# Patient Record
Sex: Female | Born: 1940 | Hispanic: No | Smoking: Never smoker
Health system: Southern US, Community
[De-identification: ages and names within clinical notes are randomized; demographics above are authoritative.]

## PROBLEM LIST (undated history)

## (undated) DIAGNOSIS — G43909 Migraine, unspecified, not intractable, without status migrainosus: Secondary | ICD-10-CM

## (undated) DIAGNOSIS — K219 Gastro-esophageal reflux disease without esophagitis: Secondary | ICD-10-CM

## (undated) DIAGNOSIS — M1990 Unspecified osteoarthritis, unspecified site: Secondary | ICD-10-CM

## (undated) DIAGNOSIS — N8110 Cystocele, unspecified: Secondary | ICD-10-CM

## (undated) DIAGNOSIS — I10 Essential (primary) hypertension: Secondary | ICD-10-CM

## (undated) DIAGNOSIS — E785 Hyperlipidemia, unspecified: Secondary | ICD-10-CM

## (undated) DIAGNOSIS — J4489 Other specified chronic obstructive pulmonary disease: Secondary | ICD-10-CM

## (undated) DIAGNOSIS — J449 Chronic obstructive pulmonary disease, unspecified: Secondary | ICD-10-CM

## (undated) DIAGNOSIS — E119 Type 2 diabetes mellitus without complications: Secondary | ICD-10-CM

## (undated) HISTORY — DX: Gastro-esophageal reflux disease without esophagitis: K21.9

## (undated) HISTORY — DX: Migraine, unspecified, not intractable, without status migrainosus: G43.909

## (undated) HISTORY — DX: Essential (primary) hypertension: I10

## (undated) HISTORY — DX: Type 2 diabetes mellitus without complications: E11.9

## (undated) HISTORY — DX: Other specified chronic obstructive pulmonary disease: J44.89

## (undated) HISTORY — DX: Hyperlipidemia, unspecified: E78.5

## (undated) HISTORY — DX: Chronic obstructive pulmonary disease, unspecified: J44.9

## (undated) HISTORY — DX: Unspecified osteoarthritis, unspecified site: M19.90

## (undated) HISTORY — PX: NO PAST SURGERIES: SHX2092

## (undated) HISTORY — DX: Cystocele, unspecified: N81.10

---

## 2011-01-10 ENCOUNTER — Ambulatory Visit (INDEPENDENT_AMBULATORY_CARE_PROVIDER_SITE_OTHER): Payer: PRIVATE HEALTH INSURANCE | Admitting: Internal Medicine

## 2011-01-10 ENCOUNTER — Encounter: Payer: Self-pay | Admitting: Internal Medicine

## 2011-01-10 DIAGNOSIS — K5900 Constipation, unspecified: Secondary | ICD-10-CM | POA: Insufficient documentation

## 2011-01-10 DIAGNOSIS — M7989 Other specified soft tissue disorders: Secondary | ICD-10-CM

## 2011-01-10 DIAGNOSIS — E785 Hyperlipidemia, unspecified: Secondary | ICD-10-CM | POA: Insufficient documentation

## 2011-01-10 DIAGNOSIS — G43909 Migraine, unspecified, not intractable, without status migrainosus: Secondary | ICD-10-CM | POA: Insufficient documentation

## 2011-01-10 DIAGNOSIS — I10 Essential (primary) hypertension: Secondary | ICD-10-CM

## 2011-01-10 NOTE — Assessment & Plan Note (Signed)
On statin therapy for treatment of same since 2010 Daughter will bring copy of last laboratory values done (In [place] 12/2010>WNL per pt report) The current medical regimen is effective;  continue present plan and medications.

## 2011-01-10 NOTE — Progress Notes (Signed)
Subjective:    Patient ID: Christina Thornton, female    DOB: 1940/05/05, 71 y.o.   MRN: [id number]  HPI  New patient to me and our practice, here today to establish care  Reviewed chronic medical issues today:  Hypertension. On ARB -the patient reports compliance with medication(s) as prescribed. Denies adverse side effects.   Dyslipidemia. On atorvastatin for years. the patient reports compliance with medication(s) as prescribed. Denies adverse side effects.  Migraine history - MRI in 2010 done (in [place]) for same showing chronic microvascular disease but no aneurysm or mass. Takes calcium channel blocker for prophylaxis (flunarizine) - to complete this medication at the end of January 2013.  Concerned about swelling of left calf Reports onset of symptoms over 2 years ago Prior evaluation in the 12 heard the swelling was related to "cyst" in her knee (?bakers) Initially associated with pain in knee but now no pain in knee or leg No change in swelling Shortness of breath or chest pain No prior history of clots  Past Medical History  Diagnosis Date  . Arthritis   . Glaucoma   . Hypertension   . Osteoporosis   . Hx of migraines   . Dyslipidemia    Family History  Problem Relation Age of Onset  . Cancer Brother     Kidney cancer   History  Substance Use Topics  . Smoking status: Never Smoker   . Smokeless tobacco: Not on file  . Alcohol Use: No   Review of Systems Constitutional: Negative for fever or weight change.  Respiratory: Negative for cough and shortness of breath.   Cardiovascular: Negative for chest pain or palpitations.  Gastrointestinal: Negative for abdominal pain, no bowel changes.  chronic constipation Musculoskeletal: Negative for gait problem or joint swelling.  Skin: Negative for rash.  Neurological: Negative for dizziness or headache.  No other specific complaints in a complete review of systems (except as listed in HPI above).       Objective:   Physical Exam BP 100/60  Pulse 81  Temp(Src) 97.9 F (36.6 C) (Oral)  Ht 5' (1.524 m)  Wt 171 lb 12.8 oz (77.928 kg)  BMI 33.55 kg/m2  SpO2 97% Wt Readings from Last 3 Encounters:  01/10/11 171 lb 12.8 oz (77.928 kg)   Constitutional: She is overweight but appears well-developed and well-nourished. No distress.  daughter at side serves as Nurse, learning disability (patient speaks no [place]) HENT: Head: Normocephalic and atraumatic. Ears: R TM ok, L TM with chronic rupture - no erythema or effusion; Nose: Nose normal. Mouth/Throat: Oropharynx is clear and moist. No oropharyngeal exudate.  Eyes: Conjunctivae and EOM are normal. Pupils are equal, round, and reactive to light. No scleral icterus.  Neck: Normal range of motion. Neck supple. No JVD present. No thyromegaly present.  Cardiovascular: Normal rate, regular rhythm and normal heart sounds.  No murmur heard. No BLE edema. Pulmonary/Chest: Effort normal and breath sounds normal. No respiratory distress. She has no wheezes.  Abdominal: Soft. Bowel sounds are normal. She exhibits no distension. There is no tenderness. no masses Musculoskeletal: Chronic swelling left calf compared to right  - bilateral knees with osteoarthritic changes, minimal effusion on left . Normal range of motion, no abnormal warmth. No gross deformities. Left greater than right foot bunions  GU: Defer to gynecology  Neurological: She is alert and oriented to person, place, and time. No cranial nerve deficit. Coordination normal.  Skin: Skin is warm and dry. No rash noted. No erythema.  Psychiatric: She has  a normal mood and affect. Her behavior is normal. Judgment and thought content normal.   No results found for this basename: WBC, HGB, HCT, PLT, GLUCOSE, CHOL, TRIG, HDL, LDLDIRECT, LDLCALC, ALT, AST, NA, K, CL, CREATININE, BUN, CO2, TSH, PSA, INR, GLUF, HGBA1C, MICROALBUR       Assessment & Plan:  See problem list. Medications and labs reviewed today. Time  spent with pt/family today 45 minutes, greater than 50% time spent counseling patient on  migraines, lipids, hypertension and medication review.   Chronic left lower extremity swelling, reports relation to "cyst behind knee" - at risk for venous thromboembolic disease with overseas airline travel. Check venous Doppler now - if persisting Baker's cyst, referred to orthopedics for evaluation as needed

## 2011-01-10 NOTE — Assessment & Plan Note (Signed)
BP Readings from Last 3 Encounters:  01/10/11 100/60   The current medical regimen is effective;  continue present plan and medications.

## 2011-01-10 NOTE — Patient Instructions (Signed)
It was good to see you today. we'll make referral for venous ultrasound of left leg to evaluate swelling. Our office will contact you regarding appointment(s) once made. Medications reviewed, no changes made today Recommend trying over-the-counter Senokot for bowels in addition to ongoing prune juice. Can also try MiraLAX powder as needed for constipation Please schedule followup in 6 months for lab recheck of cholesterol, kidneys and liver; call sooner if problems.

## 2011-01-10 NOTE — Assessment & Plan Note (Signed)
On CCB prophylaxis until end 01/2011 (unavailable in [place]> flunarizine) Reports symptoms have improved on treatment Continue same as per [place] providers and consider alternate prophylactic medication if resumed or recurrent headache symptoms

## 2011-01-10 NOTE — Assessment & Plan Note (Signed)
Functional, chronic Recommended addition of Senokot to daily prune juice and MiraLAX as needed

## 2011-01-11 ENCOUNTER — Other Ambulatory Visit: Payer: Self-pay | Admitting: Cardiology

## 2011-01-11 ENCOUNTER — Ambulatory Visit (INDEPENDENT_AMBULATORY_CARE_PROVIDER_SITE_OTHER): Payer: PRIVATE HEALTH INSURANCE | Admitting: Cardiology

## 2011-01-11 DIAGNOSIS — R229 Localized swelling, mass and lump, unspecified: Secondary | ICD-10-CM

## 2011-01-11 DIAGNOSIS — M7989 Other specified soft tissue disorders: Secondary | ICD-10-CM

## 2011-01-11 DIAGNOSIS — R609 Edema, unspecified: Secondary | ICD-10-CM

## 2011-02-27 ENCOUNTER — Ambulatory Visit: Payer: Self-pay | Admitting: Internal Medicine

## 2011-03-29 ENCOUNTER — Encounter: Payer: Self-pay | Admitting: Internal Medicine

## 2011-03-29 ENCOUNTER — Telehealth: Payer: Self-pay | Admitting: *Deleted

## 2011-03-29 ENCOUNTER — Ambulatory Visit (INDEPENDENT_AMBULATORY_CARE_PROVIDER_SITE_OTHER): Payer: PRIVATE HEALTH INSURANCE | Admitting: Internal Medicine

## 2011-03-29 VITALS — BP 128/80 | HR 68 | Temp 97.7°F | Ht 62.0 in | Wt 175.0 lb

## 2011-03-29 DIAGNOSIS — K219 Gastro-esophageal reflux disease without esophagitis: Secondary | ICD-10-CM | POA: Insufficient documentation

## 2011-03-29 DIAGNOSIS — M7120 Synovial cyst of popliteal space [Baker], unspecified knee: Secondary | ICD-10-CM | POA: Insufficient documentation

## 2011-03-29 DIAGNOSIS — M7541 Impingement syndrome of right shoulder: Secondary | ICD-10-CM | POA: Insufficient documentation

## 2011-03-29 MED ORDER — PANTOPRAZOLE SODIUM 40 MG PO TBEC: 40.0000 mg | DELAYED_RELEASE_TABLET | Freq: Every day | ORAL | Status: DC

## 2011-03-29 MED ORDER — RABEPRAZOLE SODIUM 20 MG PO TBEC: 20.0000 mg | DELAYED_RELEASE_TABLET | Freq: Every day | ORAL | Status: DC

## 2011-03-29 MED ORDER — NAPROXEN SODIUM 220 MG PO TABS: 220.0000 mg | ORAL_TABLET | Freq: Two times a day (BID) | ORAL | Status: AC

## 2011-03-29 NOTE — Assessment & Plan Note (Signed)
On rabeprazole with droperidol oral combo from [place] - explained same med combo not available here We'll resume PPI for increase in reflux symptoms since discontinuing medication. If unable to afford AcipHex, will change to generic PPI

## 2011-03-29 NOTE — Patient Instructions (Signed)
It was good to see you today. Your shoulder pain is called "impingement syndrome" - continue aleve 2x/day for 1 month and will refer to physical therapy  Continue your acid mediction - Your prescription(s) have been submitted to your pharmacy. Please take as directed and contact our office if you believe you are having problem(s) with the medication(s).  Impingement Syndrome, Rotator Cuff, Bursitis with Rehab Impingement syndrome is a condition that involves inflammation of the tendons of the rotator cuff and the subacromial bursa, that causes pain in the shoulder. The rotator cuff consists of four tendons and muscles that control much of the shoulder and upper arm function. The subacromial bursa is a fluid filled sac that helps reduce friction between the rotator cuff and one of the bones of the shoulder (acromion). Impingement syndrome is usually an overuse injury that causes swelling of the bursa (bursitis), swelling of the tendon (tendonitis), and/or a tear of the tendon (strain). Strains are classified into three categories. Grade 1 strains cause pain, but the tendon is not lengthened. Grade 2 strains include a lengthened ligament, due to the ligament being stretched or partially ruptured. With grade 2 strains there is still function, although the function may be decreased. Grade 3 strains include a complete tear of the tendon or muscle, and function is usually impaired. SYMPTOMS    Pain around the shoulder, often at the outer portion of the upper arm.   Pain that gets worse with shoulder function, especially when reaching overhead or lifting.   Sometimes, aching when not using the arm.   Pain that wakes you up at night.   Sometimes, tenderness, swelling, warmth, or redness over the affected area.   Loss of strength.   Limited motion of the shoulder, especially reaching behind the back (to the back pocket or to unhook bra) or across your body.   Crackling sound (crepitation) when moving  the arm.   Biceps tendon pain and inflammation (in the front of the shoulder). Worse when bending the elbow or lifting.  CAUSES   Impingement syndrome is often an overuse injury, in which chronic (repetitive) motions cause the tendons or bursa to become inflamed. A strain occurs when a force is paced on the tendon or muscle that is greater than it can withstand. Common mechanisms of injury include: Stress from sudden increase in duration, frequency, or intensity of training.  Direct hit (trauma) to the shoulder.   Aging, erosion of the tendon with normal use.   Bony bump on shoulder (acromial spur).  RISK INCREASES WITH:  Contact sports (football, wrestling, boxing).   Throwing sports (baseball, tennis, volleyball).   Weightlifting and bodybuilding.   Heavy labor.   Previous injury to the rotator cuff, including impingement.   Poor shoulder strength and flexibility.   Failure to warm up properly before activity.   Inadequate protective equipment.   Old age.   Bony bump on shoulder (acromial spur).  PREVENTION    Warm up and stretch properly before activity.   Allow for adequate recovery between workouts.   Maintain physical fitness:   Strength, flexibility, and endurance.   Cardiovascular fitness.   Learn and use proper exercise technique.  PROGNOSIS   If treated properly, impingement syndrome usually goes away within 6 weeks. Sometimes surgery is required.   RELATED COMPLICATIONS    Longer healing time if not properly treated, or if not given enough time to heal.   Recurring symptoms, that result in a chronic condition.   Shoulder stiffness, frozen  shoulder, or loss of motion.   Rotator cuff tendon tear.   Recurring symptoms, especially if activity is resumed too soon, with overuse, with a direct blow, or when using poor technique.  TREATMENT   Treatment first involves the use of ice and medicine, to reduce pain and inflammation. The use of strengthening  and stretching exercises may help reduce pain with activity. These exercises may be performed at home or with a therapist. If non-surgical treatment is unsuccessful after more than 6 months, surgery may be advised. After surgery and rehabilitation, activity is usually possible in 3 months.   MEDICATION  If pain medicine is needed, nonsteroidal anti-inflammatory medicines (aspirin and ibuprofen), or other minor pain relievers (acetaminophen), are often advised.   Do not take pain medicine for 7 days before surgery.   Prescription pain relievers may be given, if your caregiver thinks they are needed. Use only as directed and only as much as you need.   Corticosteroid injections may be given by your caregiver. These injections should be reserved for the most serious cases, because they may only be given a certain number of times.  HEAT AND COLD  Cold treatment (icing) should be applied for 10 to 15 minutes every 2 to 3 hours for inflammation and pain, and immediately after activity that aggravates your symptoms. Use ice packs or an ice massage.   Heat treatment may be used before performing stretching and strengthening activities prescribed by your caregiver, physical therapist, or athletic trainer. Use a heat pack or a warm water soak.  SEEK MEDICAL CARE IF:    Symptoms get worse or do not improve in 4 to 6 weeks, despite treatment.   New, unexplained symptoms develop. (Drugs used in treatment may produce side effects.)  EXERCISES   RANGE OF MOTION (ROM) AND STRETCHING EXERCISES - Impingement Syndrome (Rotator Cuff  Tendinitis, Bursitis) These exercises may help you when beginning to rehabilitate your injury. Your symptoms may go away with or without further involvement from your physician, physical therapist or athletic trainer. While completing these exercises, remember:    Restoring tissue flexibility helps normal motion to return to the joints. This allows healthier, less painful movement  and activity.   An effective stretch should be held for at least 30 seconds.   A stretch should never be painful. You should only feel a gentle lengthening or release in the stretched tissue.  STRETCH - Flexion, Standing  Stand with good posture. With an underhand grip on your right / left hand, and an overhand grip on the opposite hand, grasp a broomstick or cane so that your hands are a little more than shoulder width apart.   Keeping your right / left elbow straight and shoulder muscles relaxed, push the stick with your opposite hand, to raise your right / left arm in front of your body and then overhead. Raise your arm until you feel a stretch in your right / left shoulder, but before you have increased shoulder pain.   Try to avoid shrugging your right / left shoulder as your arm rises, by keeping your shoulder blade tucked down and toward your mid-back spine. Hold for __________ seconds.   Slowly return to the starting position.  Repeat __________ times. Complete this exercise __________ times per day. STRETCH - Abduction, Supine  Lie on your back. With an underhand grip on your right / left hand and an overhand grip on the opposite hand, grasp a broomstick or cane so that your hands are a  little more than shoulder width apart.   Keeping your right / left elbow straight and your shoulder muscles relaxed, push the stick with your opposite hand, to raise your right / left arm out to the side of your body and then overhead. Raise your arm until you feel a stretch in your right / left shoulder, but before you have increased shoulder pain.   Try to avoid shrugging your right / left shoulder as your arm rises, by keeping your shoulder blade tucked down and toward your mid-back spine. Hold for __________ seconds.   Slowly return to the starting position.  Repeat __________ times. Complete this exercise __________ times per day. ROM - Flexion, Active-Assisted  Lie on your back. You may bend  your knees for comfort.   Grasp a broomstick or cane so your hands are about shoulder width apart. Your right / left hand should grip the end of the stick, so that your hand is positioned "thumbs-up," as if you were about to shake hands.   Using your healthy arm to lead, raise your right / left arm overhead, until you feel a gentle stretch in your shoulder. Hold for __________ seconds.   Use the stick to assist in returning your right / left arm to its starting position.  Repeat __________ times. Complete this exercise __________ times per day.   ROM - Internal Rotation, Supine   Lie on your back on a firm surface. Place your right / left elbow about 60 degrees away from your side. Elevate your elbow with a folded towel, so that the elbow and shoulder are the same height.   Using a broomstick or cane and your strong arm, pull your right / left hand toward your body until you feel a gentle stretch, but no increase in your shoulder pain. Keep your shoulder and elbow in place throughout the exercise.   Hold for __________ seconds. Slowly return to the starting position.  Repeat __________ times. Complete this exercise __________ times per day. STRETCH - Internal Rotation  Place your right / left hand behind your back, palm up.   Throw a towel or belt over your opposite shoulder. Grasp the towel with your right / left hand.   While keeping an upright posture, gently pull up on the towel, until you feel a stretch in the front of your right / left shoulder.   Avoid shrugging your right / left shoulder as your arm rises, by keeping your shoulder blade tucked down and toward your mid-back spine.   Hold for __________ seconds. Release the stretch, by lowering your healthy hand.  Repeat __________ times. Complete this exercise __________ times per day. ROM - Internal Rotation   Using an underhand grip, grasp a stick behind your back with both hands.   While standing upright with good posture,  slide the stick up your back until you feel a mild stretch in the front of your shoulder.   Hold for __________ seconds. Slowly return to your starting position.  Repeat __________ times. Complete this exercise __________ times per day.   STRETCH - Posterior Shoulder Capsule   Stand or sit with good posture. Grasp your right / left elbow and draw it across your chest, keeping it at the same height as your shoulder.   Pull your elbow, so your upper arm comes in closer to your chest. Pull until you feel a gentle stretch in the back of your shoulder.   Hold for __________ seconds.  Repeat __________ times.  Complete this exercise __________ times per day. STRENGTHENING EXERCISES - Impingement Syndrome (Rotator Cuff Tendinitis, Bursitis) These exercises may help you when beginning to rehabilitate your injury. They may resolve your symptoms with or without further involvement from your physician, physical therapist or athletic trainer. While completing these exercises, remember:  Muscles can gain both the endurance and the strength needed for everyday activities through controlled exercises.   Complete these exercises as instructed by your physician, physical therapist or athletic trainer. Increase the resistance and repetitions only as guided.   You may experience muscle soreness or fatigue, but the pain or discomfort you are trying to eliminate should never worsen during these exercises. If this pain does get worse, stop and make sure you are following the directions exactly. If the pain is still present after adjustments, discontinue the exercise until you can discuss the trouble with your clinician.   During your recovery, avoid activity or exercises which involve actions that place your injured hand or elbow above your head or behind your back or head. These positions stress the tissues which you are trying to heal.  STRENGTH - Scapular Depression and Adduction   With good posture, sit on a  firm chair. Support your arms in front of you, with pillows, arm rests, or on a table top. Have your elbows in line with the sides of your body.   Gently draw your shoulder blades down and toward your mid-back spine. Gradually increase the tension, without tensing the muscles along the top of your shoulders and the back of your neck.   Hold for __________ seconds. Slowly release the tension and relax your muscles completely before starting the next repetition.   After you have practiced this exercise, remove the arm support and complete the exercise in standing as well as sitting position.  Repeat __________ times. Complete this exercise __________ times per day.   STRENGTH - Shoulder Abductors, Isometric  With good posture, stand or sit about 4-6 inches from a wall, with your right / left side facing the wall.   Bend your right / left elbow. Gently press your right / left elbow into the wall. Increase the pressure gradually, until you are pressing as hard as you can, without shrugging your shoulder or increasing any shoulder discomfort.   Hold for __________ seconds.   Release the tension slowly. Relax your shoulder muscles completely before you begin the next repetition.  Repeat __________ times. Complete this exercise __________ times per day.   STRENGTH - External Rotators, Isometric  Keep your right / left elbow at your side and bend it 90 degrees.   Step into a door frame so that the outside of your right / left wrist can press against the door frame without your upper arm leaving your side.   Gently press your right / left wrist into the door frame, as if you were trying to swing the back of your hand away from your stomach. Gradually increase the tension, until you are pressing as hard as you can, without shrugging your shoulder or increasing any shoulder discomfort.   Hold for __________ seconds.   Release the tension slowly. Relax your shoulder muscles completely before you  begin the next repetition.  Repeat __________ times. Complete this exercise __________ times per day.   STRENGTH - Supraspinatus   Stand or sit with good posture. Grasp a __________ weight, or an exercise band or tubing, so that your hand is "thumbs-up," like you are shaking hands.   Slowly  lift your right / left arm in a "V" away from your thigh, diagonally into the space between your side and straight ahead. Lift your hand to shoulder height or as far as you can, without increasing any shoulder pain. At first, many people do not lift their hands above shoulder height.   Avoid shrugging your right / left shoulder as your arm rises, by keeping your shoulder blade tucked down and toward your mid-back spine.   Hold for __________ seconds. Control the descent of your hand, as you slowly return to your starting position.  Repeat __________ times. Complete this exercise __________ times per day.   STRENGTH - External Rotators  Secure a rubber exercise band or tubing to a fixed object (table, pole) so that it is at the same height as your right / left elbow when you are standing or sitting on a firm surface.   Stand or sit so that the secured exercise band is at your uninjured side.   Bend your right / left elbow 90 degrees. Place a folded towel or small pillow under your right / left arm, so that your elbow is a few inches away from your side.   Keeping the tension on the exercise band, pull it away from your body, as if pivoting on your elbow. Be sure to keep your body steady, so that the movement is coming only from your rotating shoulder.   Hold for __________ seconds. Release the tension in a controlled manner, as you return to the starting position.  Repeat __________ times. Complete this exercise __________ times per day.   STRENGTH - Internal Rotators   Secure a rubber exercise band or tubing to a fixed object (table, pole) so that it is at the same height as your right / left elbow when  you are standing or sitting on a firm surface.   Stand or sit so that the secured exercise band is at your right / left side.   Bend your elbow 90 degrees. Place a folded towel or small pillow under your right / left arm so that your elbow is a few inches away from your side.   Keeping the tension on the exercise band, pull it across your body, toward your stomach. Be sure to keep your body steady, so that the movement is coming only from your rotating shoulder.   Hold for __________ seconds. Release the tension in a controlled manner, as you return to the starting position.  Repeat __________ times. Complete this exercise __________ times per day.   STRENGTH - Scapular Protractors, Standing   Stand arms length away from a wall. Place your hands on the wall, keeping your elbows straight.   Begin by dropping your shoulder blades down and toward your mid-back spine.   To strengthen your protractors, keep your shoulder blades down, but slide them forward on your rib cage. It will feel as if you are lifting the back of your rib cage away from the wall. This is a subtle motion and can be challenging to complete. Ask your caregiver for further instruction, if you are not sure you are doing the exercise correctly.   Hold for __________ seconds. Slowly return to the starting position, resting the muscles completely before starting the next repetition.  Repeat __________ times. Complete this exercise __________ times per day. STRENGTH - Scapular Protractors, Supine  Lie on your back on a firm surface. Extend your right / left arm straight into the air while holding a __________  weight in your hand.   Keeping your head and back in place, lift your shoulder off the floor.   Hold for __________ seconds. Slowly return to the starting position, and allow your muscles to relax completely before starting the next repetition.  Repeat __________ times. Complete this exercise __________ times per  day. STRENGTH - Scapular Protractors, Quadruped  Get onto your hands and knees, with your shoulders directly over your hands (or as close as you can be, comfortably).   Keeping your elbows locked, lift the back of your rib cage up into your shoulder blades, so your mid-back rounds out. Keep your neck muscles relaxed.   Hold this position for __________ seconds. Slowly return to the starting position and allow your muscles to relax completely before starting the next repetition.  Repeat __________ times. Complete this exercise __________ times per day.   STRENGTH - Scapular Retractors  Secure a rubber exercise band or tubing to a fixed object (table, pole), so that it is at the height of your shoulders when you are either standing, or sitting on a firm armless chair.   With a palm down grip, grasp an end of the band in each hand. Straighten your elbows and lift your hands straight in front of you, at shoulder height. Step back, away from the secured end of the band, until it becomes tense.   Squeezing your shoulder blades together, draw your elbows back toward your sides, as you bend them. Keep your upper arms lifted away from your body throughout the exercise.   Hold for __________ seconds. Slowly ease the tension on the band, as you reverse the directions and return to the starting position.  Repeat __________ times. Complete this exercise __________ times per day. STRENGTH - Shoulder Extensors   Secure a rubber exercise band or tubing to a fixed object (table, pole) so that it is at the height of your shoulders when you are either standing, or sitting on a firm armless chair.   With a thumbs-up grip, grasp an end of the band in each hand. Straighten your elbows and lift your hands straight in front of you, at shoulder height. Step back, away from the secured end of the band, until it becomes tense.   Squeezing your shoulder blades together, pull your hands down to the sides of your thighs.  Do not allow your hands to go behind you.   Hold for __________ seconds. Slowly ease the tension on the band, as you reverse the directions and return to the starting position.  Repeat __________ times. Complete this exercise __________ times per day.   STRENGTH - Scapular Retractors and External Rotators   Secure a rubber exercise band or tubing to a fixed object (table, pole) so that it is at the height as your shoulders, when you are either standing, or sitting on a firm armless chair.   With a palm down grip, grasp an end of the band in each hand. Bend your elbows 90 degrees and lift your elbows to shoulder height, at your sides. Step back, away from the secured end of the band, until it becomes tense.   Squeezing your shoulder blades together, rotate your shoulders so that your upper arms and elbows remain stationary, but your fists travel upward to head height.   Hold for __________ seconds. Slowly ease the tension on the band, as you reverse the directions and return to the starting position.  Repeat __________ times. Complete this exercise __________ times per day.  STRENGTH - Scapular Retractors and External Rotators, Rowing   Secure a rubber exercise band or tubing to a fixed object (table, pole) so that it is at the height of your shoulders, when you are either standing, or sitting on a firm armless chair.   With a palm down grip, grasp an end of the band in each hand. Straighten your elbows and lift your hands straight in front of you, at shoulder height. Step back, away from the secured end of the band, until it becomes tense.   Step 1: Squeeze your shoulder blades together. Bending your elbows, draw your hands to your chest, as if you are rowing a boat. At the end of this motion, your hands and elbow should be at shoulder height and your elbows should be out to your sides.   Step 2: Rotate your shoulders, to raise your hands above your head. Your forearms should be vertical and  your upper arms should be horizontal.   Hold for __________ seconds. Slowly ease the tension on the band, as you reverse the directions and return to the starting position.  Repeat __________ times. Complete this exercise __________ times per day.   STRENGTH - Scapular Depressors  Find a sturdy chair without wheels, such as a dining room chair.   Keeping your feet on the floor, and your hands on the chair arms, lift your bottom up from the seat, and lock your elbows.   Keeping your elbows straight, allow gravity to pull your body weight down. Your shoulders will rise toward your ears.   Raise your body against gravity by drawing your shoulder blades down your back, shortening the distance between your shoulders and ears. Although your feet should always maintain contact with the floor, your feet should progressively support less body weight, as you get stronger.   Hold for __________ seconds. In a controlled and slow manner, lower your body weight to begin the next repetition.  Repeat __________ times. Complete this exercise __________ times per day.   Document Released: 12/18/2004 Document Revised: 12/07/2010 Document Reviewed: 04/01/2008 [facility] Patient Information 2012 [place], [place].

## 2011-03-29 NOTE — Progress Notes (Signed)
Subjective:    Patient ID: Christina Thornton, female    DOB: 05/27/1940, 71 y.o.   MRN: [id number]  Arm Pain  The incident occurred more than 1 week ago. The incident occurred at home. There was no injury mechanism. The pain is present in the right shoulder. The quality of the pain is described as aching. Radiates to: Distal deltoid. The pain is at a severity of 3/10. The pain is mild. The pain has been improving since the incident. Pertinent negatives include no chest pain, muscle weakness or numbness. The symptoms are aggravated by lifting (Overhead activity). She has tried NSAIDs for the symptoms. The treatment provided moderate relief.   Also wants to review venous Doppler regarding Left leg and knee swelling, ?cyst mgmt options. chronic swelling of left calf without change. Reports onset of symptoms in 2009. Initially associated with pain in knee but now no pain in knee or leg  ?resume reflux med from [place]?  Also reviewed chronic medical issues today:  Hypertension. On ARB -the patient reports compliance with medication(s) as prescribed. Denies adverse side effects.   Dyslipidemia. On atorvastatin for years. the patient reports compliance with medication(s) as prescribed. Denies adverse side effects.  Migraine history - MRI in 2010 done (in [place]) for same showing chronic microvascular disease but no aneurysm or mass. Takes calcium channel blocker for prophylaxis (flunarizine) - to complete this medication at the end of January 2013.   Past Medical History  Diagnosis Date  . Osteoarthritis   . Glaucoma   . Hypertension   . Osteoporosis   . Migraine headache   . Dyslipidemia   . Female bladder prolapse      Review of Systems  Cardiovascular: Negative for chest pain.  Neurological: Negative for numbness.  Constitutional: Negative for fever or weight change.  Respiratory: Negative for cough and shortness of breath.   Gastrointestinal: Negative for abdominal pain.   chronic constipation      Objective:   Physical Exam  BP 128/80  Pulse 68  Temp(Src) 97.7 F (36.5 C) (Oral)  Ht 5\' 2"  (1.575 m)  Wt 175 lb (79.379 kg)  BMI 32.01 kg/m2  SpO2 99% Wt Readings from Last 3 Encounters:  03/29/11 175 lb (79.379 kg)  01/10/11 171 lb 12.8 oz (77.928 kg)   Constitutional: She is overweight but appears well-developed and well-nourished. No distress.  daughter at side serves as Nurse, learning disability (patient speaks no English) Neck: Normal range of motion. Neck supple. No JVD present. No thyromegaly present.  Cardiovascular: Normal rate, regular rhythm and normal heart sounds.  No murmur heard. No BLE edema. Pulmonary/Chest: Effort normal and breath sounds normal. No respiratory distress. She has no wheezes.  Musculoskeletal: Chronic swelling left calf compared to right  - bilateral knees with osteoarthritic changes, minimal effusion on left . Normal range of motion, no abnormal warmth. No gross deformities. Left greater than right foot bunions - R Shoulder: Full range of motion. Neurovascularly intact distally. Good strength with stress of rotator cuff but causes pain. Positive impingement signs. Neurological: She is alert and oriented to person, place, and time. No cranial nerve deficit. Coordination normal.  Skin: Dry skin, winter dermatitis B LE/feet. Skin is warm and dry. No rash noted. No erythema.    No results found for this basename: WBC,  HGB,  HCT,  PLT,  GLUCOSE,  CHOL,  TRIG,  HDL,  LDLDIRECT,  LDLCALC,  ALT,  AST,  NA,  K,  CL,  CREATININE,  BUN,  CO2,  TSH,  PSA,  INR,  GLUF,  HGBA1C,  MICROALBUR   LLE venous doppler 01/2011: popliteal cyst 2.0x3.0 cm - prev 4.5x2.9 cm 06/2007 doppler    Assessment & Plan:  See problem list. Medications and labs reviewed today.  Impingement syndrome, right shoulder - reviewed diagnosis with patient and family today. Offered steroid injection but as patient has improved with twice a day Aleve, will first maximize oral  anti-inflammatory therapy. Also refer to physical therapy and provided exercises to help shoulder strengthening. Will reconsider need for steroid injection if symptoms worse or unimproved with this care  Chronic left lower extremity swelling, due to popliteal cyst behind knee Reviewed smaller size in 2013 compared to 2009, also decrease in knee pain since 2009 If recurring knee pain, increased swelling or other problems, will refer to orthopedics for evaluation as needed

## 2011-03-29 NOTE — Telephone Encounter (Signed)
Daughter called insurance will not cover the Rabeprazole. Requesting md to rx something else generic... 03/29/11@2 :20pm/LMB

## 2011-03-29 NOTE — Telephone Encounter (Signed)
Daughter was advise med will be sent to pharmacy... 03/29/11@2 :24pm/LMB

## 2011-03-29 NOTE — Telephone Encounter (Signed)
Change to pantoprotazole (chosen over omeprazole b/c pt also on plavix) - ex done

## 2011-07-12 ENCOUNTER — Ambulatory Visit (INDEPENDENT_AMBULATORY_CARE_PROVIDER_SITE_OTHER): Payer: PRIVATE HEALTH INSURANCE | Admitting: Internal Medicine

## 2011-07-12 ENCOUNTER — Encounter: Payer: Self-pay | Admitting: Internal Medicine

## 2011-07-12 ENCOUNTER — Other Ambulatory Visit (INDEPENDENT_AMBULATORY_CARE_PROVIDER_SITE_OTHER): Payer: PRIVATE HEALTH INSURANCE

## 2011-07-12 VITALS — BP 112/78 | HR 72 | Temp 98.2°F | Ht 62.0 in | Wt 187.0 lb

## 2011-07-12 DIAGNOSIS — E785 Hyperlipidemia, unspecified: Secondary | ICD-10-CM

## 2011-07-12 DIAGNOSIS — Z1239 Encounter for other screening for malignant neoplasm of breast: Secondary | ICD-10-CM

## 2011-07-12 DIAGNOSIS — R5381 Other malaise: Secondary | ICD-10-CM

## 2011-07-12 DIAGNOSIS — K219 Gastro-esophageal reflux disease without esophagitis: Secondary | ICD-10-CM

## 2011-07-12 DIAGNOSIS — R5383 Other fatigue: Secondary | ICD-10-CM

## 2011-07-12 DIAGNOSIS — I10 Essential (primary) hypertension: Secondary | ICD-10-CM

## 2011-07-12 DIAGNOSIS — Z124 Encounter for screening for malignant neoplasm of cervix: Secondary | ICD-10-CM

## 2011-07-12 LAB — CBC WITH DIFFERENTIAL/PLATELET
Basophils Absolute: 0.1 10*3/uL (ref 0.0–0.1)
Basophils Relative: 0.7 % (ref 0.0–3.0)
Eosinophils Absolute: 0.1 10*3/uL (ref 0.0–0.7)
HCT: 39 % (ref 36.0–46.0)
Hemoglobin: 12.9 g/dL (ref 12.0–15.0)
Lymphocytes Relative: 25.2 % (ref 12.0–46.0)
Lymphs Abs: 1.8 10*3/uL (ref 0.7–4.0)
MCHC: 33 g/dL (ref 30.0–36.0)
MCV: 88.5 fl (ref 78.0–100.0)
Monocytes Absolute: 0.5 10*3/uL (ref 0.1–1.0)
Neutro Abs: 4.7 10*3/uL (ref 1.4–7.7)
RBC: 4.41 Mil/uL (ref 3.87–5.11)
RDW: 14.3 % (ref 11.5–14.6)

## 2011-07-12 LAB — HEPATIC FUNCTION PANEL
ALT: 12 U/L (ref 0–35)
AST: 20 U/L (ref 0–37)
Albumin: 3.7 g/dL (ref 3.5–5.2)
Total Protein: 7.4 g/dL (ref 6.0–8.3)

## 2011-07-12 LAB — LIPID PANEL
Cholesterol: 136 mg/dL (ref 0–200)
HDL: 73.3 mg/dL (ref >39.00)
Triglycerides: 58 mg/dL (ref 0.0–149.0)

## 2011-07-12 LAB — BASIC METABOLIC PANEL
Calcium: 9.1 mg/dL (ref 8.4–10.5)
Creatinine, Ser: 0.8 mg/dL (ref 0.4–1.2)
GFR: 77.49 mL/min (ref >60.00)
Sodium: 139 mEq/L (ref 135–145)

## 2011-07-12 LAB — TSH: TSH: 1.56 u[IU]/mL (ref 0.35–5.50)

## 2011-07-12 MED ORDER — PANTOPRAZOLE SODIUM 40 MG PO TBEC: 40.0000 mg | DELAYED_RELEASE_TABLET | Freq: Every day | ORAL | Status: DC

## 2011-07-12 NOTE — Progress Notes (Signed)
Subjective:    Patient ID: Christina Thornton, female    DOB: 03-17-1940, 71 y.o.   MRN: [id number]  HPI  here for follow up - reviewed chronic medical issues today:  Hypertension. On ARB -the patient reports compliance with medication(s) as prescribed. Denies adverse side effects.   Dyslipidemia. On atorvastatin for years. the patient reports compliance with medication(s) as prescribed. Denies adverse side effects.  Migraine history - MRI in 2010 done (in [place]) for same showing chronic microvascular disease but no aneurysm or mass. Takes calcium channel blocker for prophylaxis (flunarizine) - to complete this medication at the end of January 2013.  GERD - on PPI with good symptoms control - the patient reports compliance with medication(s) as prescribed. Denies adverse side effects.   Also requests gyn refer for hx D&C 2011 (in [place]) - no bleeding but ?follow up   Past Medical History  Diagnosis Date  . Osteoarthritis   . Glaucoma   . Hypertension   . Osteoporosis   . Migraine headache   . Dyslipidemia   . Female bladder prolapse   . GERD (gastroesophageal reflux disease)      Review of Systems Constitutional: Negative for fever or unexpected weight change. +fatigue, tired Respiratory: Negative for cough and shortness of breath.   Gastrointestinal: Negative for abdominal pain.  chronic constipation      Objective:   Physical Exam  BP 112/78  Pulse 72  Temp 98.2 F (36.8 C) (Oral)  Ht 5\' 2"  (1.575 m)  Wt 187 lb (84.823 kg)  BMI 34.20 kg/m2  SpO2 97% Wt Readings from Last 3 Encounters:  07/12/11 187 lb (84.823 kg)  03/29/11 175 lb (79.379 kg)  01/10/11 171 lb 12.8 oz (77.928 kg)   Constitutional: She is overweight but appears well-developed and well-nourished. No distress.  daughter at side serves as Nurse, learning disability (patient speaks no [place]) Cardiovascular: Normal rate, regular rhythm and normal heart sounds.  No murmur heard. No BLE edema. Pulmonary/Chest:  Effort normal and breath sounds normal. No respiratory distress. She has no wheezes.  Musculoskeletal: Chronic mild swelling left calf compared to right  - bilateral knees with osteoarthritic changes, minimal effusion on left . Normal range of motion, no abnormal warmth. No gross deformities. Left greater than right foot bunions - R Shoulder: Full range of motion. Neurovascularly intact distally. Good strength with stress of rotator cuff but causes pain. Positive impingement signs. Skin: Dry mild dermatitis B LE/feet. Skin is warm and dry. No rash noted. No erythema.   No results found for this basename: WBC, HGB, HCT, PLT, GLUCOSE, CHOL, TRIG, HDL, LDLDIRECT, LDLCALC, ALT, AST, NA, K, CL, CREATININE, BUN, CO2, TSH, PSA, INR, GLUF, HGBA1C, MICROALBUR    LLE venous doppler 01/2011: popliteal cyst 2.0x3.0 cm - prev 4.5x2.9 cm 06/2007 doppler    Assessment & Plan:  See problem list. Medications and labs reviewed today.  Fatigue - nonspecific hx and exam - will check screening labs -  Impingement syndrome, right shoulder - symptoms ongoing but much improved with oral anti-inflammatory therapy and physical therapy 04/2011. Also uses [place] muscle relaxer (thiocolchicoside 4mg  prn) - will call if [employer] med needed in future. Will reconsider need for steroid injection or ortho eval if symptoms worse or unimproved with this care  Chronic left lower extremity swelling, due to popliteal cyst behind knee - reviewed symptoms again today. Reviewed smaller size in 01/2011 compared to 2009, also decrease in knee pain since 2009 If recurring knee pain, increased swelling or other problems, will  refer to orthopedics for evaluation as needed

## 2011-07-12 NOTE — Assessment & Plan Note (Signed)
BP Readings from Last 3 Encounters:  07/12/11 112/78  03/29/11 128/80  01/10/11 100/60   The current medical regimen is effective;  continue present plan and medications.

## 2011-07-12 NOTE — Patient Instructions (Signed)
It was good to see you today. Test(s) ordered today. Your results will be called to you after review (48-72hours after test completion). If any changes need to be made, you will be notified at that time. Medications reviewed, no changes at this time. Let us know if you need any muscle relaxer prescription in the future Refill on medication(s) as discussed today. we'll make referral to gynecology and for mammogram screening. Our office will contact you regarding appointment(s) once made. Consider colonoscopy screening and call if you would like referral for this test Please schedule followup in 6 months, call sooner if problems.

## 2011-07-12 NOTE — Assessment & Plan Note (Signed)
On statin therapy for treatment of same since 2010 Check lipids annually (reviewed laboratory values 12/2010 done in [place]) The current medical regimen is effective;  continue present plan and medications.

## 2011-07-12 NOTE — Assessment & Plan Note (Signed)
On rabeprazole with droperidol oral combo from [place] - explained same med combo not available here Started protoprazole 03/2011 due to increase in reflux symptoms since discontinuing medication and unable to afford Aciphex here. The current medical regimen is effective;  continue present plan and medications.

## 2011-07-25 ENCOUNTER — Telehealth: Payer: Self-pay

## 2011-07-25 MED ORDER — PANTOPRAZOLE SODIUM 40 MG PO TBEC: 40.0000 mg | DELAYED_RELEASE_TABLET | Freq: Every day | ORAL | Status: DC

## 2011-07-25 NOTE — Telephone Encounter (Signed)
Pt's daughter called requesting Rx for Protonix be sent to [facility] for a 90 day supply per [employer].

## 2011-08-08 ENCOUNTER — Encounter: Payer: Self-pay | Admitting: Internal Medicine

## 2011-10-12 ENCOUNTER — Telehealth: Payer: Self-pay | Admitting: Internal Medicine

## 2011-10-12 NOTE — Telephone Encounter (Signed)
Left message on machine for pt's daughter to return my call  

## 2011-10-12 NOTE — Telephone Encounter (Signed)
Please call dtr to see what she has in mind - thanks

## 2011-10-12 NOTE — Telephone Encounter (Signed)
Patients daughter is calling because she says the Protonix is not helping her mother and she would like to speak with someone to discuss other medication options

## 2011-10-15 NOTE — Telephone Encounter (Signed)
Left message on machine for pt's daughter to return my call  

## 2011-10-17 NOTE — Telephone Encounter (Signed)
Left message on machine for pt's dauhgter to return my call. Closing phone note until further contact from pt/daughter

## 2011-10-18 ENCOUNTER — Telehealth: Payer: Self-pay

## 2011-10-18 MED ORDER — RABEPRAZOLE SODIUM 20 MG PO TBEC: 20.0000 mg | DELAYED_RELEASE_TABLET | Freq: Every day | ORAL | Status: AC

## 2011-10-18 NOTE — Telephone Encounter (Signed)
Ok for change  Please let family know, besides aciphex which is likely expensive (and the protonix apparently not working as well), there is generic prevacid which might be OK for her and be less expensive, or even nexium might be less expensive than aciphex

## 2011-10-18 NOTE — Telephone Encounter (Signed)
Pt's daughter called stating Protonix is not helping with pt's GERD sxs. Pt is requesting to be changed back to Aciphex, please advise in Dr Diamantina Monks absence, thanks!

## 2011-10-18 NOTE — Telephone Encounter (Signed)
Patients daughter informed

## 2011-11-01 ENCOUNTER — Ambulatory Visit (INDEPENDENT_AMBULATORY_CARE_PROVIDER_SITE_OTHER): Payer: PRIVATE HEALTH INSURANCE | Admitting: General Practice

## 2011-11-01 DIAGNOSIS — Z23 Encounter for immunization: Secondary | ICD-10-CM

## 2012-01-17 ENCOUNTER — Ambulatory Visit (INDEPENDENT_AMBULATORY_CARE_PROVIDER_SITE_OTHER): Payer: PRIVATE HEALTH INSURANCE | Admitting: Internal Medicine

## 2012-01-17 ENCOUNTER — Encounter: Payer: Self-pay | Admitting: Internal Medicine

## 2012-01-17 VITALS — BP 112/78 | HR 76 | Temp 98.0°F | Ht 60.0 in | Wt 183.0 lb

## 2012-01-17 DIAGNOSIS — I10 Essential (primary) hypertension: Secondary | ICD-10-CM

## 2012-01-17 DIAGNOSIS — E119 Type 2 diabetes mellitus without complications: Secondary | ICD-10-CM | POA: Insufficient documentation

## 2012-01-17 DIAGNOSIS — R739 Hyperglycemia, unspecified: Secondary | ICD-10-CM

## 2012-01-17 DIAGNOSIS — G43909 Migraine, unspecified, not intractable, without status migrainosus: Secondary | ICD-10-CM

## 2012-01-17 DIAGNOSIS — E785 Hyperlipidemia, unspecified: Secondary | ICD-10-CM

## 2012-01-17 DIAGNOSIS — R7309 Other abnormal glucose: Secondary | ICD-10-CM

## 2012-01-17 MED ORDER — BENZONATATE 100 MG PO CAPS: 100.0000 mg | ORAL_CAPSULE | Freq: Two times a day (BID) | ORAL | Status: DC | PRN

## 2012-01-17 MED ORDER — DICLOFENAC SODIUM 75 MG PO TBEC: 75.0000 mg | DELAYED_RELEASE_TABLET | Freq: Two times a day (BID) | ORAL | Status: DC | PRN

## 2012-01-17 NOTE — Assessment & Plan Note (Signed)
On statin therapy for treatment of same since 2010 Check lipids  The current medical regimen is effective;  continue present plan and medications.

## 2012-01-17 NOTE — Assessment & Plan Note (Signed)
On CCB prophylaxis until end 01/2011 (unavailable in [place]> flunarizine) Reports occular symptoms have recurred off treatment, but does not wish to resume same MRI brain [place] 2010 as above  - consider alternate prophylactic medication if persisting headache symptoms

## 2012-01-17 NOTE — Progress Notes (Signed)
Subjective:    Patient ID: Christina Thornton, female    DOB: [birth year], 72 y.o.   MRN: [id number]  HPI here for follow up - reviewed chronic medical issues today:  Hypertension. On ARB -the patient reports compliance with medication(s) as prescribed. Denies adverse side effects.   Dyslipidemia. On atorvastatin for years. the patient reports compliance with medication(s) as prescribed. Denies adverse side effects.  Migraine history - MRI in 2010 done (in [place]) for same showing chronic microvascular disease but no aneurysm or mass. Previously on calcium channel blocker for prophylaxis (flunarizine) thru January 2013, does not wish to resume same but increasing occular symptoms off medication (flash followed by headache every 3-4 weeks).  GERD - on PPI with good symptoms control - the patient reports compliance with medication(s) as prescribed. Denies adverse side effects.    Past Medical History  Diagnosis Date  . Osteoarthritis   . Glaucoma   . Hypertension   . Osteoporosis   . Migraine headache   . Dyslipidemia   . Female bladder prolapse   . GERD (gastroesophageal reflux disease)      Review of Systems Constitutional: Negative for fever or unexpected weight change. +fatigue, tired Respiratory: Negative for cough and shortness of breath.   Gastrointestinal: Negative for abdominal pain.  chronic constipation      Objective:   Physical Exam  BP 112/78  Pulse 76  Temp 98 F (36.7 C) (Oral)  Ht 5' (1.524 m)  Wt 183 lb (83.008 kg)  BMI 35.74 kg/m2  SpO2 97% Wt Readings from Last 3 Encounters:  01/17/12 183 lb (83.008 kg)  07/12/11 187 lb (84.823 kg)  03/29/11 175 lb (79.379 kg)   Constitutional: She is overweight but appears well-developed and well-nourished. No distress.  daughter at side serves as Nurse, learning disability (patient speaks no [place]) Cardiovascular: Normal rate, regular rhythm and normal heart sounds.  No murmur heard. No BLE edema. Pulmonary/Chest: Effort  normal and breath sounds normal. No respiratory distress. She has no wheezes.  Musculoskeletal: Chronic mild swelling left calf compared to right  - bilateral knees with osteoarthritic changes, minimal effusion on left . Normal range of motion, no abnormal warmth. No gross deformities. Left greater than right foot bunions - R Shoulder: Full range of motion. Neurovascularly intact distally. Good strength with stress of rotator cuff, causes min pain. no impingement signs. Skin: Dry mild dermatitis B LE/feet. Skin is warm and dry. No rash noted. No erythema.   Lab Results  Component Value Date   WBC 7.1 07/12/2011   HGB 12.9 07/12/2011   HCT 39.0 07/12/2011   PLT 305.0 07/12/2011   GLUCOSE 120* 07/12/2011   CHOL 136 07/12/2011   TRIG 58.0 07/12/2011   HDL 73.30 07/12/2011   LDLCALC 51 07/12/2011   ALT 12 07/12/2011   AST 20 07/12/2011   NA 139 07/12/2011   K 4.6 07/12/2011   CL 103 07/12/2011   CREATININE 0.8 07/12/2011   BUN 14 07/12/2011   CO2 29 07/12/2011   TSH 1.56 07/12/2011   HGBA1C 6.3 07/12/2011    LLE venous doppler 01/2011: popliteal cyst 2.0x3.0 cm - prev 4.5x2.9 cm 06/2007 doppler    Assessment & Plan:  See problem list. Medications and labs reviewed today.   Impingement syndrome, right shoulder - symptoms ongoing but much improved with oral anti-inflammatory therapy and physical therapy 04/2011. Also uses [place] muscle relaxer (thiocolchicoside 4mg  prn) - will call if [employer] med needed in future. Will reconsider need for steroid injection or ortho eval  if symptoms worse or unimproved with this care  Chronic left lower extremity swelling, due to popliteal cyst behind knee - reviewed symptoms again today. Reviewed smaller size in 01/2011 compared to 2009, also decrease in knee pain since 2009 If recurring knee pain, increased swelling or other problems, will refer to orthopedics for evaluation as needed

## 2012-01-17 NOTE — Assessment & Plan Note (Signed)
Working on diet and weight control Check a1c q6-50mo to monitor same Lab Results  Component Value Date   HGBA1C 6.3 07/12/2011

## 2012-01-17 NOTE — Assessment & Plan Note (Signed)
BP Readings from Last 3 Encounters:  01/17/12 112/78  07/12/11 112/78  03/29/11 128/80   The current medical regimen is effective;  continue present plan and medications.

## 2012-01-17 NOTE — Patient Instructions (Signed)
It was good to see you today. Test(s) ordered today. Your results will be called to you after review (48-72hours after test completion). If any changes need to be made, you will be notified at that time. Medications reviewed, use tessalon as needed for cough and Voltaren as needed for pain -Refill on medication(s) as discussed today. we'll make referral for carotid doppler check. Our office will contact you regarding appointment(s) once made. Consider colonoscopy screening and/or physical therapy for your shoulder- call if you would like referral for this before your next appointment Please schedule followup in 6 months, call sooner if problems.

## 2012-01-23 ENCOUNTER — Other Ambulatory Visit (INDEPENDENT_AMBULATORY_CARE_PROVIDER_SITE_OTHER): Payer: PRIVATE HEALTH INSURANCE

## 2012-01-23 DIAGNOSIS — R7309 Other abnormal glucose: Secondary | ICD-10-CM

## 2012-01-23 DIAGNOSIS — I10 Essential (primary) hypertension: Secondary | ICD-10-CM

## 2012-01-23 DIAGNOSIS — R739 Hyperglycemia, unspecified: Secondary | ICD-10-CM

## 2012-01-23 DIAGNOSIS — E785 Hyperlipidemia, unspecified: Secondary | ICD-10-CM

## 2012-01-23 LAB — LIPID PANEL
Cholesterol: 132 mg/dL (ref 0–200)
LDL Cholesterol: 54 mg/dL (ref 0–99)
Total CHOL/HDL Ratio: 2
VLDL: 12.8 mg/dL (ref 0.0–40.0)

## 2012-01-29 ENCOUNTER — Telehealth: Payer: Self-pay | Admitting: *Deleted

## 2012-01-29 NOTE — Telephone Encounter (Signed)
Received call from daughter wanting mom lab results. She stated results are not on my-chart. Gave daughter md response concerning labs, also mail copy to per her request...01/29/12

## 2012-02-04 ENCOUNTER — Telehealth: Payer: Self-pay | Admitting: *Deleted

## 2012-02-04 NOTE — Telephone Encounter (Signed)
Notified pt daughter with md response.../lmb 

## 2012-02-04 NOTE — Telephone Encounter (Signed)
In addition to benzonate, patient should take OTC Mucinex D 2x/day for next 7-10days, then as needed  Okay to refill Tessalon as needed

## 2012-02-04 NOTE — Telephone Encounter (Signed)
Daughter states mom is still staking the benazotate that was rx, but she still coughing. She has develop some chest congestion but is not able to cough anything up. Also has stuffy nose. Denies fever. Wanting to know should she continue taking med or md rx something else...Christina Thornton

## 2012-02-08 ENCOUNTER — Telehealth: Payer: Self-pay | Admitting: *Deleted

## 2012-02-08 ENCOUNTER — Ambulatory Visit (INDEPENDENT_AMBULATORY_CARE_PROVIDER_SITE_OTHER)
Admission: RE | Admit: 2012-02-08 | Discharge: 2012-02-08 | Disposition: A | Discharge status: Home / Self Care | Payer: BC Managed Care – PPO | Source: Ambulatory Visit | Attending: Internal Medicine | Admitting: Internal Medicine

## 2012-02-08 ENCOUNTER — Encounter: Payer: Self-pay | Admitting: Internal Medicine

## 2012-02-08 ENCOUNTER — Ambulatory Visit (INDEPENDENT_AMBULATORY_CARE_PROVIDER_SITE_OTHER): Payer: BC Managed Care – PPO | Admitting: Internal Medicine

## 2012-02-08 ENCOUNTER — Other Ambulatory Visit (INDEPENDENT_AMBULATORY_CARE_PROVIDER_SITE_OTHER): Payer: BC Managed Care – PPO

## 2012-02-08 VITALS — BP 122/84 | HR 77 | Temp 98.0°F | Wt 183.8 lb

## 2012-02-08 DIAGNOSIS — R05 Cough: Secondary | ICD-10-CM

## 2012-02-08 DIAGNOSIS — R062 Wheezing: Secondary | ICD-10-CM

## 2012-02-08 DIAGNOSIS — R5383 Other fatigue: Secondary | ICD-10-CM

## 2012-02-08 DIAGNOSIS — R5381 Other malaise: Secondary | ICD-10-CM

## 2012-02-08 DIAGNOSIS — R059 Cough, unspecified: Secondary | ICD-10-CM

## 2012-02-08 LAB — CBC WITH DIFFERENTIAL/PLATELET
Basophils Absolute: 0 10*3/uL (ref 0.0–0.1)
Eosinophils Absolute: 0.1 10*3/uL (ref 0.0–0.7)
Eosinophils Relative: 1.3 % (ref 0.0–5.0)
Lymphs Abs: 2.4 10*3/uL (ref 0.7–4.0)
MCHC: 33.2 g/dL (ref 30.0–36.0)
MCV: 85.4 fl (ref 78.0–100.0)
Monocytes Absolute: 0.6 10*3/uL (ref 0.1–1.0)
Neutrophils Relative %: 68.8 % (ref 43.0–77.0)
Platelets: 340 10*3/uL (ref 150.0–400.0)
RDW: 13.9 % (ref 11.5–14.6)
WBC: 9.9 10*3/uL (ref 4.5–10.5)

## 2012-02-08 MED ORDER — ALBUTEROL SULFATE HFA 108 (90 BASE) MCG/ACT IN AERS: 2.0000 | INHALATION_SPRAY | Freq: Four times a day (QID) | RESPIRATORY_TRACT | Status: DC | PRN

## 2012-02-08 MED ORDER — AZITHROMYCIN 250 MG PO TABS: ORAL_TABLET | ORAL | Status: DC

## 2012-02-08 NOTE — Telephone Encounter (Signed)
Called pt daughter gave md response. Transferred to schedulers for appt...lmb

## 2012-02-08 NOTE — Patient Instructions (Signed)
It was good to see you today. Test(s) ordered today. Your results will be released to MyChart (or called to you) after review, usually within 72hours after test completion. If any changes need to be made, you will be notified at that same time. Zpak antibiotics and inhaler for cough and wheeze symptoms - Your prescription(s) have been submitted to your pharmacy. Please take as directed and contact our office if you believe you are having problem(s) with the medication(s). Further evaluation will depend on your response to treatment -let us know if unimproved in the next 2 weeks, call sooner if worse

## 2012-02-08 NOTE — Telephone Encounter (Signed)
Recommend OV

## 2012-02-08 NOTE — Telephone Encounter (Signed)
Daughter states mom has been taking the mucinex DM since Monday not helping. Still coughing up thick clear phlegm. Also she been wheezing. Requesting md advisement...Raechel Chute

## 2012-02-08 NOTE — Telephone Encounter (Signed)
Yes, please encourage her for eval with Rene Kocher - thanks

## 2012-02-08 NOTE — Progress Notes (Signed)
  Subjective:    Patient ID: Christina Thornton, female    DOB: 16-Dec-1940, 72 y.o.   MRN: [id number]  HPI  Complains of continued dry cough Onset approximately one month ago Symptoms unchanged day or night Occasional clear sputum associated with same Denies fever, dyspnea on exertion but upper airway wheeze Slightly improved with Mucinex but not resolved -unchanged symptoms with Tessalon Denies change in chronic reflux symptoms, compliant with PPI as prescribed No history of tobacco exposure  Past Medical History  Diagnosis Date  . Osteoarthritis   . Glaucoma   . Hypertension   . Osteoporosis   . Migraine headache   . Dyslipidemia   . Female bladder prolapse   . GERD (gastroesophageal reflux disease)   . Hyperglycemia     Review of Systems  Constitutional: Positive for fatigue. Negative for fever.  Respiratory: Positive for cough and wheezing. Negative for choking, chest tightness and shortness of breath.   Cardiovascular: Negative for chest pain, palpitations and leg swelling.       Objective:   Physical Exam BP 122/84  Pulse 77  Temp 98 F (36.7 C) (Oral)  Wt 183 lb 12.8 oz (83.371 kg)  SpO2 96% Wt Readings from Last 3 Encounters:  02/08/12 183 lb 12.8 oz (83.371 kg)  01/17/12 183 lb (83.008 kg)  07/12/11 187 lb (84.823 kg)   Constitutional: She appears well-developed and well-nourished. No distress. dtr at side Neck: Normal range of motion. Neck supple. No JVD present. No thyromegaly present.  Cardiovascular: Normal rate, regular rhythm and normal heart sounds.  No murmur heard. No BLE edema. Pulmonary/Chest: Effort normal and breath sounds normal. No respiratory distress. She has no wheezes.  Psychiatric: She has a normal mood and affect. Her behavior is normal. Judgment and thought content normal.   Lab Results  Component Value Date   WBC 7.1 07/12/2011   HGB 12.9 07/12/2011   HCT 39.0 07/12/2011   PLT 305.0 07/12/2011   GLUCOSE 120* 07/12/2011   CHOL  132 01/23/2012   TRIG 64.0 01/23/2012   HDL 65.30 01/23/2012   LDLCALC 54 01/23/2012   ALT 12 07/12/2011   AST 20 07/12/2011   NA 139 07/12/2011   K 4.6 07/12/2011   CL 103 07/12/2011   CREATININE 0.8 07/12/2011   BUN 14 07/12/2011   CO2 29 07/12/2011   TSH 1.56 07/12/2011   HGBA1C 6.7* 01/23/2012   No results found for this basename: ESRSEDRATE, SEDRATE, POCTSEDRATE      Assessment & Plan:  Fatigue - nonspecific symptoms/exam - check screening labs  Cough, dry > 4 weeks - reports associated wheeze, but none present on exam today Daughter reports similar symptoms last winter but no history of allergies or asthma known  Check 2v chest x-ray today Check CBC as above for fatigue Empiric antibiotics given duration greater than 4 weeks Albuterol inhaler as needed for wheeze Further treatment to depend on response to therapy and results -consider PFTs for evaluation of cold-induced asthma if unimproved

## 2012-02-18 ENCOUNTER — Telehealth: Payer: Self-pay

## 2012-02-18 DIAGNOSIS — R05 Cough: Secondary | ICD-10-CM

## 2012-02-18 NOTE — Telephone Encounter (Signed)
Pt daughter called [facility] states cough is somewhat better, however, she is still congested. She is requesting a call back to see what else is suggested, ABX has been completed thanks

## 2012-02-18 NOTE — Telephone Encounter (Signed)
Continue same meds (albuterol MDI and ucinex) and i will refer to pulm for further eval/tx as needed No other tx changes recommended

## 2012-02-19 MED ORDER — HYDROCODONE-HOMATROPINE 5-1.5 MG/5ML PO SYRP: 5.0000 mL | ORAL_SOLUTION | Freq: Four times a day (QID) | ORAL | Status: DC | PRN

## 2012-02-19 NOTE — Telephone Encounter (Signed)
claled pt daughter back with md response. Daughter want to hold off on the referral. She states someone rx hydromet cough syrup before and it clear congestion up. The cough is much better she just still have the congestion. Also she states when she uses the albuterol inhaler she tend to get laryngitis. Should she continue using...Raechel Chute

## 2012-02-19 NOTE — Telephone Encounter (Signed)
A) refer to pulm cancelled per pt pref B) ok for hydromet C) use [place] only prn shortness of breath or if cough unimproved with other meds

## 2012-02-19 NOTE — Addendum Note (Signed)
Addended by: Rene Paci A on: 02/19/2012 01:42 PM   Modules accepted: Orders

## 2012-02-19 NOTE — Telephone Encounter (Signed)
Notified pt with md response. Fax rx to [facility]...Raechel Chute

## 2012-02-20 ENCOUNTER — Encounter (INDEPENDENT_AMBULATORY_CARE_PROVIDER_SITE_OTHER): Payer: BC Managed Care – PPO

## 2012-02-20 DIAGNOSIS — R739 Hyperglycemia, unspecified: Secondary | ICD-10-CM

## 2012-02-20 DIAGNOSIS — E785 Hyperlipidemia, unspecified: Secondary | ICD-10-CM

## 2012-02-20 DIAGNOSIS — H53129 Transient visual loss, unspecified eye: Secondary | ICD-10-CM

## 2012-02-20 DIAGNOSIS — I10 Essential (primary) hypertension: Secondary | ICD-10-CM

## 2012-02-20 DIAGNOSIS — G43909 Migraine, unspecified, not intractable, without status migrainosus: Secondary | ICD-10-CM

## 2012-06-24 ENCOUNTER — Other Ambulatory Visit: Payer: Self-pay | Admitting: Internal Medicine

## 2012-07-24 ENCOUNTER — Ambulatory Visit: Payer: PRIVATE HEALTH INSURANCE | Admitting: Internal Medicine

## 2012-08-28 ENCOUNTER — Ambulatory Visit (INDEPENDENT_AMBULATORY_CARE_PROVIDER_SITE_OTHER): Payer: BC Managed Care – PPO | Admitting: Internal Medicine

## 2012-08-28 ENCOUNTER — Telehealth: Payer: Self-pay | Admitting: Internal Medicine

## 2012-08-28 ENCOUNTER — Other Ambulatory Visit (INDEPENDENT_AMBULATORY_CARE_PROVIDER_SITE_OTHER): Payer: BC Managed Care – PPO

## 2012-08-28 ENCOUNTER — Encounter: Payer: Self-pay | Admitting: Internal Medicine

## 2012-08-28 VITALS — BP 118/74 | HR 79 | Temp 98.4°F | Wt 180.8 lb

## 2012-08-28 DIAGNOSIS — Z23 Encounter for immunization: Secondary | ICD-10-CM

## 2012-08-28 DIAGNOSIS — H9202 Otalgia, left ear: Secondary | ICD-10-CM

## 2012-08-28 DIAGNOSIS — R739 Hyperglycemia, unspecified: Secondary | ICD-10-CM

## 2012-08-28 DIAGNOSIS — I10 Essential (primary) hypertension: Secondary | ICD-10-CM

## 2012-08-28 DIAGNOSIS — R7309 Other abnormal glucose: Secondary | ICD-10-CM

## 2012-08-28 DIAGNOSIS — Z124 Encounter for screening for malignant neoplasm of cervix: Secondary | ICD-10-CM

## 2012-08-28 DIAGNOSIS — H9209 Otalgia, unspecified ear: Secondary | ICD-10-CM

## 2012-08-28 LAB — BASIC METABOLIC PANEL
Chloride: 103 mEq/L (ref 96–112)
GFR: 82.08 mL/min (ref >60.00)
Glucose, Bld: 108 mg/dL — ABNORMAL HIGH (ref 70–99)
Potassium: 4.2 mEq/L (ref 3.5–5.1)
Sodium: 139 mEq/L (ref 135–145)

## 2012-08-28 MED ORDER — ANTIPYRINE-BENZOCAINE 5.4-1.4 % OT SOLN: 3.0000 [drp] | OTIC | Status: DC | PRN

## 2012-08-28 NOTE — Telephone Encounter (Signed)
Patient is stating that when she has used ear drops in the past it has caused more pain.  Would like a call regarding this matter.  If you can not reach at number listed you can reach at [phone].

## 2012-08-28 NOTE — Telephone Encounter (Signed)
Notified pt daughter with md response.../lmb 

## 2012-08-28 NOTE — Assessment & Plan Note (Signed)
Working on diet and weight control Check a1c q6-16mo to monitor same Lab Results  Component Value Date   HGBA1C 6.7* 01/23/2012

## 2012-08-28 NOTE — Progress Notes (Signed)
Subjective:    Patient ID: Christina Thornton, female    DOB: 1940/02/01, 71 y.o.   MRN: [id number]  HPI here for follow up - reviewed chronic medical issues and interval medical events:  Hypertension. On ARB -the patient reports compliance with medication(s) as prescribed. Denies adverse side effects.   Dyslipidemia. On atorvastatin for years. the patient reports compliance with medication(s) as prescribed. Denies adverse side effects.  Migraine history - MRI in 2010 done (in [place]) for same showing chronic microvascular disease but no aneurysm or mass. Previously on calcium channel blocker for prophylaxis (flunarizine) thru January 2013, does not wish to resume same but increasing occular symptoms off medication (flash followed by headache every 3-4 weeks).  GERD - on PPI with good symptoms control - the patient reports compliance with medication(s) as prescribed. Denies adverse side effects.    Past Medical History  Diagnosis Date  . Osteoarthritis   . Glaucoma   . Hypertension   . Osteoporosis   . Migraine headache   . Dyslipidemia   . Female bladder prolapse   . GERD (gastroesophageal reflux disease)   . Hyperglycemia     Review of Systems Constitutional: Negative for fever or unexpected weight change. +fatigue, tired ENT: mild left ear pain and "burning" x 4 days Respiratory: Negative for cough and shortness of breath.   Gastrointestinal: Negative for abdominal pain.  chronic constipation      Objective:   Physical Exam BP 118/74  Pulse 79  Temp(Src) 98.4 F (36.9 C) (Oral)  Wt 180 lb 12 oz (81.988 kg)  BMI 35.3 kg/m2  SpO2 97% Wt Readings from Last 3 Encounters:  08/28/12 180 lb 12 oz (81.988 kg)  02/08/12 183 lb 12.8 oz (83.371 kg)  01/17/12 183 lb (83.008 kg)   Constitutional: She is overweight but appears well-developed and well-nourished. No distress.  daughter at side serves as Nurse, learning disability (patient speaks no [place]) ENT: mild abrasion canal on left  - TMs B clea without effusion or erythema Cardiovascular: Normal rate, regular rhythm and normal heart sounds.  No murmur heard. No BLE edema. Pulmonary/Chest: Effort normal and breath sounds normal. No respiratory distress. She has no wheezes.  Musculoskeletal: Chronic mild swelling left calf compared to right  - bilateral knees with osteoarthritic changes, minimal effusion on left . Normal range of motion, no abnormal warmth. No gross deformities. Left greater than right foot bunions - R Shoulder: Full range of motion. Neurovascularly intact distally. Good strength with stress of rotator cuff, causes min pain. no impingement signs. Skin: Dry mild dermatitis B LE/feet. Skin is warm and dry. No rash noted. No erythema.   Lab Results  Component Value Date   WBC 9.9 02/08/2012   HGB 12.7 02/08/2012   HCT 38.2 02/08/2012   PLT 340.0 02/08/2012   GLUCOSE 120* 07/12/2011   CHOL 132 01/23/2012   TRIG 64.0 01/23/2012   HDL 65.30 01/23/2012   LDLCALC 54 01/23/2012   ALT 12 07/12/2011   AST 20 07/12/2011   NA 139 07/12/2011   K 4.6 07/12/2011   CL 103 07/12/2011   CREATININE 0.8 07/12/2011   BUN 14 07/12/2011   CO2 29 07/12/2011   TSH 1.70 02/08/2012   HGBA1C 6.7* 01/23/2012    LLE venous doppler 01/2011: popliteal cyst 2.0x3.0 cm - prev 4.5x2.9 cm 06/2007 doppler    Assessment & Plan:   L ear pain - mild abrasion on canal, but no erythema, exudate, edema or TM effuion - topical See problem list. Medications and  labs reviewed today.

## 2012-08-28 NOTE — Telephone Encounter (Signed)
Because of pt concerns, I recommend to not use prescribed ear drops.  No other advice or medication needed The canal "scratch" will heal on its on and the pain will go away

## 2012-08-28 NOTE — Assessment & Plan Note (Signed)
BP Readings from Last 3 Encounters:  08/28/12 118/74  02/08/12 122/84  01/17/12 112/78   The current medical regimen is effective;  continue present plan and medications.

## 2012-08-28 NOTE — Patient Instructions (Signed)
It was good to see you today. We have reviewed your prior records including labs and tests today Test(s) ordered today. Your results will be called to you after review (48-72hours after test completion). If any changes need to be made, you will be notified at that time. Medications reviewed, use ear drops as needed for pain -Refill on medication(s) as discussed today. we'll make referral for gynecology evaluation. Our office will contact you regarding appointment(s) once made. Please schedule followup in 6 months for weight and sugar check, call sooner if problems.

## 2012-09-09 ENCOUNTER — Ambulatory Visit: Payer: BC Managed Care – PPO

## 2012-09-10 ENCOUNTER — Encounter: Payer: Self-pay | Admitting: Internal Medicine

## 2012-09-16 ENCOUNTER — Ambulatory Visit (INDEPENDENT_AMBULATORY_CARE_PROVIDER_SITE_OTHER): Payer: BC Managed Care – PPO | Admitting: Internal Medicine

## 2012-09-16 ENCOUNTER — Encounter: Payer: Self-pay | Admitting: Internal Medicine

## 2012-09-16 ENCOUNTER — Telehealth: Payer: Self-pay | Admitting: Internal Medicine

## 2012-09-16 VITALS — BP 110/80 | HR 72 | Temp 97.0°F | Wt 183.0 lb

## 2012-09-16 DIAGNOSIS — J069 Acute upper respiratory infection, unspecified: Secondary | ICD-10-CM

## 2012-09-16 MED ORDER — OSELTAMIVIR PHOSPHATE 75 MG PO CAPS: 75.0000 mg | ORAL_CAPSULE | Freq: Every day | ORAL | Status: DC

## 2012-09-16 NOTE — Patient Instructions (Addendum)
Viral upper respiratory infection with no signs or symptoms to suggest a bacterial infection. The Ear drums are bulging slightly. No indication for antibiotics  Plan Tylenol 500 mg every 6 hours for aches  Sudafed (pseudoephedrine) 30 mg - from behind the counter three times a day for 7 days.  Gargle of choice  Vitamin C 1500 mg daily  Ecchinacea - dose based on product - the tea is good, along with honey and lemon.  For neck pain - Range of motion, rub of choice.     Viral Infections A viral infection can be caused by different types of viruses.Most viral infections are not serious and resolve on their own. However, some infections may cause severe symptoms and may lead to further complications. SYMPTOMS Viruses can frequently cause:  Minor sore throat.  Aches and pains.  Headaches.  Runny nose.  Different types of rashes.  Watery eyes.  Tiredness.  Cough.  Loss of appetite.  Gastrointestinal infections, resulting in nausea, vomiting, and diarrhea. These symptoms do not respond to antibiotics because the infection is not caused by bacteria. However, you might catch a bacterial infection following the viral infection. This is sometimes called a "superinfection." Symptoms of such a bacterial infection may include:  Worsening sore throat with pus and difficulty swallowing.  Swollen neck glands.  Chills and a high or persistent fever.  Severe headache.  Tenderness over the sinuses.  Persistent overall ill feeling (malaise), muscle aches, and tiredness (fatigue).  Persistent cough.  Yellow, green, or brown mucus production with coughing. HOME CARE INSTRUCTIONS   Only take over-the-counter or prescription medicines for pain, discomfort, diarrhea, or fever as directed by your caregiver.  Drink enough water and fluids to keep your urine clear or pale yellow. Sports drinks can provide valuable electrolytes, sugars, and hydration.  Get plenty of rest and maintain  proper nutrition. Soups and broths with crackers or rice are fine. SEEK IMMEDIATE MEDICAL CARE IF:   You have severe headaches, shortness of breath, chest pain, neck pain, or an unusual rash.  You have uncontrolled vomiting, diarrhea, or you are unable to keep down fluids.  You or your child has an oral temperature above 102 F (38.9 C), not controlled by medicine.  Your baby is older than 3 months with a rectal temperature of 102 F (38.9 C) or higher.  Your baby is 36 months old or younger with a rectal temperature of 100.4 F (38 C) or higher. MAKE SURE YOU:   Understand these instructions.  Will watch your condition.  Will get help right away if you are not doing well or get worse. Document Released: 09/27/2004 Document Revised: 03/12/2011 Document Reviewed: 04/24/2010 [facility] Patient Information 2014 [place], [place].

## 2012-09-16 NOTE — Progress Notes (Signed)
Subjective:    Patient ID: Christina Thornton, female    DOB: November 10, 1940, 72 y.o.   MRN: [id number]  HPI Christina Thornton has diffuse myalgias, ears feel closed and head and neck pain, scratchy throat. No fever. No N/V/D. No cough.   Past Medical History  Diagnosis Date  . Osteoarthritis   . Glaucoma   . Hypertension   . Osteoporosis   . Migraine headache   . Dyslipidemia   . Female bladder prolapse   . GERD (gastroesophageal reflux disease)   . Hyperglycemia    Past Surgical History  Procedure Laterality Date  . No past surgeries     Family History  Problem Relation Age of Onset  . Cancer Brother     Kidney cancer   History   Social History  . Marital Status: Widowed    Spouse Name: N/A    Number of Children: N/A  . Years of Education: N/A   Occupational History  . Not on file.   Social History Main Topics  . Smoking status: Never Smoker   . Smokeless tobacco: Not on file  . Alcohol Use: No  . Drug Use: No  . Sexual Activity: Not on file   Other Topics Concern  . Not on file   Social History Narrative  . No narrative on file    Current Outpatient Prescriptions on File Prior to Visit  Medication Sig Dispense Refill  . albuterol (PROVENTIL HFA;VENTOLIN HFA) 108 (90 BASE) MCG/ACT inhaler Inhale 2 puffs into the lungs every 6 (six) hours as needed for wheezing.  1 Inhaler  0  . antipyrine-benzocaine (AURALGAN) otic solution Place 3 drops in ear(s) every 2 (two) hours as needed for pain.  10 mL  0  . atorvastatin (LIPITOR) 10 MG tablet Take 10 mg by mouth daily.      . benzonatate (TESSALON) 100 MG capsule TAKE 1 CAPSULE BY MOUTH TWICE DAILY  30 capsule  0  . clopidogrel (PLAVIX) 75 MG tablet Take 75 mg by mouth daily.      Marland Kitchen dextromethorphan-guaiFENesin (MUCINEX DM) 30-600 MG per 12 hr tablet Take 1 tablet by mouth 2 (two) times daily.      . diclofenac (VOLTAREN) 75 MG EC tablet Take 1 tablet (75 mg total) by mouth 2 (two) times daily as needed (pain).   60 tablet  0  . latanoprost (XALATAN) 0.005 % ophthalmic solution Place 1 drop into the right eye daily.      Marland Kitchen losartan (COZAAR) 25 MG tablet Take 25 mg by mouth daily.      . NON FORMULARY Cremalax 10 mg take 1 as needed for constipation      . NON FORMULARY Thiocolchicoside 4 mg take 1 by mouth as needed      . RABEprazole (ACIPHEX) 20 MG tablet Take 1 tablet (20 mg total) by mouth daily.  90 tablet  2   No current facility-administered medications on file prior to visit.      Review of Systems System review is negative for any constitutional, cardiac, pulmonary, GI or neuro symptoms or complaints other than as described in the HPI.     Objective:   Physical Exam Filed Vitals:   09/16/12 1158  BP: 110/80  Pulse: 72  Temp: 97 F (36.1 C)   Gen'l- older [place] woman in no distress HEENT - Left EAC with cerumen, w/o impaction. TM is normal. Right EAC is clear, TM with healed over TM perforation. Bulging noted. Mild tenderness frontal sinus. Cor -  2+ radial, RRR Pulm - CTAP       Assessment & Plan:  1. Viral upper respiratory infection with no signs or symptoms to suggest a bacterial infection. The Ear drums are bulging slightly. No indication for antibiotics  Plan Tylenol 500 mg every 6 hours for aches  Sudafed (pseudoephedrine) 30 mg - from behind the counter three times a day for 7 days.  Gargle of choice  Vitamin C 1500 mg daily  Ecchinacea - dose based on product - the tea is good, along with honey and lemon.  For neck pain - Range of motion, rub of choice.  2. Exposure to household contact with flu like illness  Plan Tamiflu 75 mg daily x 10 days.

## 2012-09-16 NOTE — Telephone Encounter (Signed)
Patient Information:  Caller Name: Jonetta Speak  Phone: [phone]  Patient: Christina Thornton, Christina Thornton  Gender: Female  DOB: October 04, 1940  Age: 72 Years  PCP: Rene Paci (Adults only)  Office Follow Up:  Does the office need to follow up with this patient?: No  Instructions For The Office: N/A  RN Note:  Pt has Sore Throat w/ Ear Pain onset 9-16. Daughter has appt at 1800 for same sxs, Daughter wanting to seen at 1130 also d/t not feeling well, doesn't want to drive to office twice.  Office will discuss w/ Dr Arthur Holms and call Pt/Daughter back.  Daughter has to leave now d/t transit time to office. Advised Daughter she may not be able to be seen w/ Pt at 1130 unless MD is able to fit her in.  Daughter verbalized understanding.  Symptoms  Reason For Call & Symptoms: ER CALL. Sore Throat  Reviewed Health History In EMR: N/A  Reviewed Medications In EMR: N/A  Reviewed Allergies In EMR: N/A  Reviewed Surgeries / Procedures: N/A  Date of Onset of Symptoms: 09/16/2012  Treatments Tried: Aleve at 10am on 9-16  Treatments Tried Worked: No  Guideline(s) Used:  Sore Throat  Disposition Per Guideline:   See Today in Office  Reason For Disposition Reached:   Earache also present  Advice Given:  N/A  Patient Will Follow Care Advice:  YES  Appointment Scheduled:  09/16/2012 11:30:00 Appointment Scheduled Provider:  Illene Regulus (Adults only)

## 2012-10-07 ENCOUNTER — Ambulatory Visit (INDEPENDENT_AMBULATORY_CARE_PROVIDER_SITE_OTHER): Payer: BC Managed Care – PPO | Admitting: *Deleted

## 2012-10-07 DIAGNOSIS — Z23 Encounter for immunization: Secondary | ICD-10-CM

## 2012-10-07 DIAGNOSIS — Z2911 Encounter for prophylactic immunotherapy for respiratory syncytial virus (RSV): Secondary | ICD-10-CM

## 2013-02-24 ENCOUNTER — Ambulatory Visit: Payer: BC Managed Care – PPO | Admitting: Internal Medicine

## 2013-03-24 ENCOUNTER — Encounter: Payer: Self-pay | Admitting: Internal Medicine

## 2013-03-24 ENCOUNTER — Ambulatory Visit (INDEPENDENT_AMBULATORY_CARE_PROVIDER_SITE_OTHER): Payer: BC Managed Care – PPO | Admitting: Internal Medicine

## 2013-03-24 ENCOUNTER — Other Ambulatory Visit (INDEPENDENT_AMBULATORY_CARE_PROVIDER_SITE_OTHER): Payer: BC Managed Care – PPO

## 2013-03-24 VITALS — BP 122/84 | HR 67 | Temp 97.7°F | Wt 180.1 lb

## 2013-03-24 DIAGNOSIS — R739 Hyperglycemia, unspecified: Secondary | ICD-10-CM

## 2013-03-24 DIAGNOSIS — Z23 Encounter for immunization: Secondary | ICD-10-CM

## 2013-03-24 DIAGNOSIS — I10 Essential (primary) hypertension: Secondary | ICD-10-CM

## 2013-03-24 DIAGNOSIS — M1990 Unspecified osteoarthritis, unspecified site: Secondary | ICD-10-CM

## 2013-03-24 DIAGNOSIS — R7309 Other abnormal glucose: Secondary | ICD-10-CM

## 2013-03-24 DIAGNOSIS — E785 Hyperlipidemia, unspecified: Secondary | ICD-10-CM

## 2013-03-24 DIAGNOSIS — Z1211 Encounter for screening for malignant neoplasm of colon: Secondary | ICD-10-CM

## 2013-03-24 LAB — BASIC METABOLIC PANEL
BUN: 14 mg/dL (ref 6–23)
CALCIUM: 9.2 mg/dL (ref 8.4–10.5)
CO2: 29 mEq/L (ref 19–32)
Chloride: 102 mEq/L (ref 96–112)
Creatinine, Ser: 0.7 mg/dL (ref 0.4–1.2)
GFR: 95.18 mL/min (ref >60.00)
Glucose, Bld: 118 mg/dL — ABNORMAL HIGH (ref 70–99)
Potassium: 4.2 mEq/L (ref 3.5–5.1)
SODIUM: 138 meq/L (ref 135–145)

## 2013-03-24 LAB — LIPID PANEL
CHOLESTEROL: 129 mg/dL (ref 0–200)
HDL: 63.8 mg/dL (ref >39.00)
LDL CALC: 55 mg/dL (ref 0–99)
TRIGLYCERIDES: 52 mg/dL (ref 0.0–149.0)
Total CHOL/HDL Ratio: 2
VLDL: 10.4 mg/dL (ref 0.0–40.0)

## 2013-03-24 LAB — HEMOGLOBIN A1C: HEMOGLOBIN A1C: 7 % — AB (ref 4.6–6.5)

## 2013-03-24 MED ORDER — GLUCOSAMINE-CHONDROITIN 500-400 MG PO TABS: 1.0000 | ORAL_TABLET | Freq: Three times a day (TID) | ORAL | Status: AC

## 2013-03-24 NOTE — Assessment & Plan Note (Signed)
Working on diet and weight control Check a1c q6-8759mo to monitor same Lab Results  Component Value Date   HGBA1C 6.9* 08/28/2012

## 2013-03-24 NOTE — Addendum Note (Signed)
Addended by: Deatra JamesBRAND, Analyah Mcconnon M on: 03/24/2013 10:09 AM   Modules accepted: Orders

## 2013-03-24 NOTE — Assessment & Plan Note (Signed)
BP Readings from Last 3 Encounters:  03/24/13 122/84  09/16/12 110/80  08/28/12 118/74   The current medical regimen is effective;  continue present plan and medications.

## 2013-03-24 NOTE — Assessment & Plan Note (Signed)
On statin therapy for treatment of same since 2010 Check lipids annually and adjust as needed The current medical regimen is effective;  continue present plan and medications.  

## 2013-03-24 NOTE — Patient Instructions (Addendum)
It was good to see you today.  We have reviewed your prior records including labs and tests today  Tetanus immunization with pertussis (Tdap) updated today  Test(s) ordered today. Your results will be called to you after review (48-72hours after test completion). If any changes need to be made, you will be notified at that time.  Medications reviewed and updated Ok for glucosamine chondrotin to treat arthritis pain symptoms  we'll make referral for Colonoscopy with gastroenterology. Our office will contact you regarding appointment(s) once made.  Please schedule followup in 6 months for weight and sugar check, call sooner if problems.

## 2013-03-24 NOTE — Progress Notes (Signed)
Subjective:    Patient ID: Christina Thornton, female    DOB: 21-Apr-1940, 73 y.o.   MRN: [id number]  HPI here for follow up - reviewed chronic medical issues and interval medical events:  Hypertension. On ARB -the patient reports compliance with medication(s) as prescribed. Denies adverse side effects.   Dyslipidemia. On atorvastatin for years. the patient reports compliance with medication(s) as prescribed. Denies adverse side effects.  Migraine history - MRI in 2010 done (in [place]) for same showing chronic microvascular disease but no aneurysm or mass. Previously on calcium channel blocker for prophylaxis (flunarizine) thru January 2013, does not wish to resume same but increasing occular symptoms off medication (flash followed by headache every 3-4 weeks).  GERD - on PPI with good symptoms control - the patient reports compliance with medication(s) as prescribed. Denies adverse side effects.    Past Medical History  Diagnosis Date  . Osteoarthritis   . Glaucoma   . Hypertension   . Osteoporosis   . Migraine headache   . Dyslipidemia   . Female bladder prolapse   . GERD (gastroesophageal reflux disease)   . Hyperglycemia     Review of Systems  Constitutional: Positive for fatigue (chronic). Negative for fever and unexpected weight change.  Respiratory: Negative for cough and shortness of breath.   Gastrointestinal: Positive for constipation (chronic). Negative for nausea, abdominal pain and diarrhea.        Objective:   Physical Exam BP 122/84  Pulse 67  Temp(Src) 97.7 F (36.5 C) (Oral)  Wt 180 lb 1.9 oz (81.702 kg)  SpO2 97% Wt Readings from Last 3 Encounters:  03/24/13 180 lb 1.9 oz (81.702 kg)  09/16/12 183 lb (83.008 kg)  08/28/12 180 lb 12 oz (81.988 kg)   Constitutional: She is overweight but appears well-developed and well-nourished. No distress.  daughter at side serves as Nurse, learning disability (patient speaks no [place]) ENT: L TM with perforation (chronic) -  no drainage or erythema. R TM clear without cerumen, effusion or erythema Cardiovascular: Normal rate, regular rhythm and normal heart sounds.  No murmur heard. No BLE edema. Pulmonary/Chest: Effort normal and breath sounds normal. No respiratory distress. She has no wheezes.  Musculoskeletal: Chronic mild swelling left calf compared to right  - bilateral knees with osteoarthritic changes, minimal effusion on left . Normal range of motion, no abnormal warmth. No gross deformities. Left greater than right foot bunions - R Shoulder: Full range of motion. Neurovascularly intact distally. Good strength with stress of rotator cuff, causes min pain. no impingement signs. Skin: Dry mild dermatitis B LE/feet. Skin is warm and dry. No rash noted. No erythema.   Lab Results  Component Value Date   WBC 9.9 02/08/2012   HGB 12.7 02/08/2012   HCT 38.2 02/08/2012   PLT 340.0 02/08/2012   GLUCOSE 108* 08/28/2012   CHOL 132 01/23/2012   TRIG 64.0 01/23/2012   HDL 65.30 01/23/2012   LDLCALC 54 01/23/2012   ALT 12 07/12/2011   AST 20 07/12/2011   NA 139 08/28/2012   K 4.2 08/28/2012   CL 103 08/28/2012   CREATININE 0.7 08/28/2012   BUN 12 08/28/2012   CO2 29 08/28/2012   TSH 1.70 02/08/2012   HGBA1C 6.9* 08/28/2012    LLE venous doppler 01/2011: popliteal cyst 2.0x3.0 cm - prev 4.5x2.9 cm 06/2007 doppler    Assessment & Plan:   Problem List Items Addressed This Visit   Dyslipidemia     On statin therapy for treatment of same since 2010  Check lipids annually and adjust as needed The current medical regimen is effective;  continue present plan and medications.      Relevant Orders      Hemoglobin A1c      Lipid panel   Hyperglycemia      Working on diet and weight control Check a1c q6-6575mo to monitor same Lab Results  Component Value Date   HGBA1C 6.9* 08/28/2012      Relevant Orders      Hemoglobin A1c      Lipid panel      Basic metabolic panel   Hypertension - Primary      BP Readings from Last 3  Encounters:  03/24/13 122/84  09/16/12 110/80  08/28/12 118/74   The current medical regimen is effective;  continue present plan and medications.    Relevant Orders      Hemoglobin A1c      Lipid panel      Basic metabolic panel   Osteoarthritis    Other Visit Diagnoses   Special screening for malignant neoplasms, colon        Relevant Orders       Ambulatory referral to Gastroenterology

## 2013-03-24 NOTE — Progress Notes (Signed)
Pre visit review using our clinic review tool, if applicable. No additional management support is needed unless otherwise documented below in the visit note. 

## 2013-03-30 ENCOUNTER — Encounter: Payer: Self-pay | Admitting: Internal Medicine

## 2013-04-24 ENCOUNTER — Encounter: Payer: Self-pay | Admitting: Internal Medicine

## 2013-05-02 ENCOUNTER — Ambulatory Visit (INDEPENDENT_AMBULATORY_CARE_PROVIDER_SITE_OTHER): Payer: BC Managed Care – PPO | Admitting: Family Medicine

## 2013-05-02 ENCOUNTER — Encounter: Payer: Self-pay | Admitting: Family Medicine

## 2013-05-02 VITALS — BP 120/80 | HR 78 | Temp 98.1°F | Wt 179.0 lb

## 2013-05-02 DIAGNOSIS — J310 Chronic rhinitis: Secondary | ICD-10-CM

## 2013-05-02 DIAGNOSIS — J329 Chronic sinusitis, unspecified: Secondary | ICD-10-CM

## 2013-05-02 MED ORDER — BENZONATATE 100 MG PO CAPS
ORAL_CAPSULE | ORAL | Status: DC
Start: 2013-05-02 — End: 2013-11-24

## 2013-05-02 NOTE — Progress Notes (Signed)
Pre visit review using our clinic review tool, if applicable. No additional management support is needed unless otherwise documented below in the visit note. 

## 2013-05-02 NOTE — Patient Instructions (Signed)
INSTRUCTIONS FOR UPPER RESPIRATORY INFECTION:  -plenty of rest and fluids  -nasal saline wash 2-3 times daily (use prepackaged nasal saline or bottled/distilled water if making your own)   -clean nose with nasal saline before using the nasal steroid or AFRIN  -if you are taking a cough medication - use only as directed, may also try a teaspoon of honey to coat the throat and throat lozenges  -for sore throat, salt water gargles can help  -follow up if you have fevers, facial pain, tooth pain, difficulty breathing or are worsening or not getting better in 5-7 days

## 2013-05-02 NOTE — Progress Notes (Signed)
Chief Complaint  Patient presents with  . Cough    congestion, headache x 1 week     HPI:  -started: about 1 week ago -symptoms:nasal congestion, sore throat, cough, sinus pressure - headache is resolved b -denies:fever, SOB, NVD, tooth pain, sinus pain -has tried: OTC musinex -sick contacts/travel/risks: denies flu exposure, tick exposure or or Ebola risks -Hx of: allergies  ROS: See pertinent positives and negatives per HPI.  Past Medical History  Diagnosis Date  . Osteoarthritis   . Glaucoma     [facility]  . Hypertension   . Osteoporosis   . Migraine headache   . Dyslipidemia   . Female bladder prolapse   . GERD (gastroesophageal reflux disease)   . Hyperglycemia     Past Surgical History  Procedure Laterality Date  . No past surgeries      Family History  Problem Relation Age of Onset  . Cancer Brother     Kidney cancer    History   Social History  . Marital Status: Widowed    Spouse Name: N/A    Number of Children: N/A  . Years of Education: N/A   Social History Main Topics  . Smoking status: Never Smoker   . Smokeless tobacco: None  . Alcohol Use: No  . Drug Use: No  . Sexual Activity: None   Other Topics Concern  . None   Social History Narrative  . None    Current outpatient prescriptions:atorvastatin (LIPITOR) 10 MG tablet, Take 10 mg by mouth daily., Disp: , Rfl: ;  benzonatate (TESSALON) 100 MG capsule, TAKE 1 CAPSULE BY MOUTH TWICE DAILY, Disp: 30 capsule, Rfl: 0;  clopidogrel (PLAVIX) 75 MG tablet, Take 75 mg by mouth daily., Disp: , Rfl: ;  dextromethorphan-guaiFENesin (MUCINEX DM) 30-600 MG per 12 hr tablet, Take 1 tablet by mouth 2 (two) times daily., Disp: , Rfl:  glucosamine-chondroitin (MAX GLUCOSAMINE CHONDROITIN) 500-400 MG tablet, Take 1 tablet by mouth 3 (three) times daily., Disp: , Rfl: ;  latanoprost (XALATAN) 0.005 % ophthalmic solution, Place 1 drop into the right eye daily., Disp: , Rfl: ;  losartan  (COZAAR) 25 MG tablet, Take 25 mg by mouth daily., Disp: , Rfl: ;  NON FORMULARY, Cremalax 10 mg take 1 as needed for constipation, Disp: , Rfl:  NON FORMULARY, Thiocolchicoside 4 mg take 1 by mouth as needed, Disp: , Rfl: ;  RABEprazole (ACIPHEX) 20 MG tablet, Take 1 tablet (20 mg total) by mouth daily., Disp: 90 tablet, Rfl: 2;  timolol (TIMOPTIC) 0.5 % ophthalmic solution, Use one drop in both eyes everyday, Disp: , Rfl:   EXAM:  Filed Vitals:   05/02/13 0920  BP: 120/80  Pulse: 78  Temp: 98.1 F (36.7 C)    Body mass index is 34.96 kg/(m^2).  GENERAL: vitals reviewed and listed above, alert, oriented, appears well hydrated and in no acute distress  HEENT: atraumatic, conjunttiva clear, no obvious abnormalities on inspection of external nose and ears, normal appearance of ear canals and TMs, clear nasal congestion, mild post oropharyngeal erythema with PND, no tonsillar edema or exudate, no sinus TTP  NECK: no obvious masses on inspection  LUNGS: clear to auscultation bilaterally, no wheezes, rales or rhonchi, good air movement  CV: HRRR, no peripheral edema  MS: moves all extremities without noticeable abnormality  PSYCH: pleasant and cooperative, no obvious depression or anxiety  ASSESSMENT AND PLAN:  Discussed the following assessment and plan:  Rhinosinusitis - Plan: benzonatate (TESSALON)  100 MG capsule  -given HPI and exam findings today, a serious infection or illness is unlikely. We discussed potential etiologies, with VURI being most likely, and advised supportive care and monitoring. We discussed treatment side effects, likely course, antibiotic misuse, transmission, and signs of developing a serious illness. -of course, we advised to return or notify a doctor immediately if symptoms worsen or persist or new concerns arise.    Patient Instructions  INSTRUCTIONS FOR UPPER RESPIRATORY INFECTION:  -plenty of rest and fluids  -nasal saline wash 2-3 times daily  (use prepackaged nasal saline or bottled/distilled water if making your own)   -clean nose with nasal saline before using the nasal steroid or AFRIN  -if you are taking a cough medication - use only as directed, may also try a teaspoon of honey to coat the throat and throat lozenges  -for sore throat, salt water gargles can help  -follow up if you have fevers, facial pain, tooth pain, difficulty breathing or are worsening or not getting better in 5-7 days      Terressa KoyanagiHannah R. Kim

## 2013-05-04 ENCOUNTER — Telehealth: Payer: Self-pay | Admitting: *Deleted

## 2013-05-04 ENCOUNTER — Encounter: Payer: Self-pay | Admitting: Internal Medicine

## 2013-05-04 MED ORDER — ALBUTEROL SULFATE HFA 108 (90 BASE) MCG/ACT IN AERS: 2.0000 | INHALATION_SPRAY | Freq: Four times a day (QID) | RESPIRATORY_TRACT | Status: DC | PRN

## 2013-05-04 MED ORDER — PREDNISONE (PAK) 10 MG PO TABS: ORAL_TABLET | ORAL | Status: DC

## 2013-05-04 NOTE — Telephone Encounter (Signed)
[facility] on sat for cough sxs. Was rx tessalon perarle for cough, but now mom is wheezing. Requesting md to rx something for the wheezing...Raechel Chute/lmb

## 2013-05-04 NOTE — Telephone Encounter (Signed)
pred taper x 6 days and Alb MDI - erx done ROV if worse or unimproved in next 72h thanks

## 2013-05-04 NOTE — Telephone Encounter (Signed)
Notified pt with md response. Daughter is wanting [place] [facility] [place]. Inform pt will resend...Raechel Chute/lmb

## 2013-09-27 ENCOUNTER — Encounter: Payer: Self-pay | Admitting: Internal Medicine

## 2013-09-28 MED ORDER — CETIRIZINE HCL 10 MG PO TABS: 10.0000 mg | ORAL_TABLET | Freq: Every day | ORAL | Status: AC

## 2013-09-28 NOTE — Telephone Encounter (Signed)
Patient called back in regards.  °

## 2013-09-29 ENCOUNTER — Ambulatory Visit: Payer: BC Managed Care – PPO | Admitting: Internal Medicine

## 2013-10-27 ENCOUNTER — Ambulatory Visit (INDEPENDENT_AMBULATORY_CARE_PROVIDER_SITE_OTHER): Payer: BC Managed Care – PPO

## 2013-10-27 DIAGNOSIS — Z23 Encounter for immunization: Secondary | ICD-10-CM

## 2013-10-29 ENCOUNTER — Ambulatory Visit: Payer: BC Managed Care – PPO

## 2013-11-24 ENCOUNTER — Ambulatory Visit (INDEPENDENT_AMBULATORY_CARE_PROVIDER_SITE_OTHER): Payer: BC Managed Care – PPO | Admitting: Internal Medicine

## 2013-11-24 ENCOUNTER — Encounter: Payer: Self-pay | Admitting: Internal Medicine

## 2013-11-24 ENCOUNTER — Other Ambulatory Visit (INDEPENDENT_AMBULATORY_CARE_PROVIDER_SITE_OTHER): Payer: BC Managed Care – PPO

## 2013-11-24 VITALS — BP 134/80 | HR 72 | Temp 97.9°F | Ht 60.0 in | Wt 176.8 lb

## 2013-11-24 DIAGNOSIS — R0789 Other chest pain: Secondary | ICD-10-CM

## 2013-11-24 DIAGNOSIS — I10 Essential (primary) hypertension: Secondary | ICD-10-CM

## 2013-11-24 DIAGNOSIS — E119 Type 2 diabetes mellitus without complications: Secondary | ICD-10-CM

## 2013-11-24 DIAGNOSIS — E785 Hyperlipidemia, unspecified: Secondary | ICD-10-CM

## 2013-11-24 DIAGNOSIS — R739 Hyperglycemia, unspecified: Secondary | ICD-10-CM

## 2013-11-24 DIAGNOSIS — Z1239 Encounter for other screening for malignant neoplasm of breast: Secondary | ICD-10-CM

## 2013-11-24 LAB — LIPID PANEL
CHOLESTEROL: 128 mg/dL (ref 0–200)
HDL: 58.8 mg/dL (ref >39.00)
LDL Cholesterol: 57 mg/dL (ref 0–99)
NonHDL: 69.2
Total CHOL/HDL Ratio: 2
Triglycerides: 63 mg/dL (ref 0.0–149.0)
VLDL: 12.6 mg/dL (ref 0.0–40.0)

## 2013-11-24 LAB — BASIC METABOLIC PANEL
BUN: 13 mg/dL (ref 6–23)
CO2: 27 mEq/L (ref 19–32)
Calcium: 9.1 mg/dL (ref 8.4–10.5)
Chloride: 101 mEq/L (ref 96–112)
Creatinine, Ser: 0.6 mg/dL (ref 0.4–1.2)
GFR: 104.19 mL/min (ref >60.00)
Glucose, Bld: 117 mg/dL — ABNORMAL HIGH (ref 70–99)
POTASSIUM: 4.3 meq/L (ref 3.5–5.1)
SODIUM: 137 meq/L (ref 135–145)

## 2013-11-24 LAB — HEMOGLOBIN A1C: Hgb A1c MFr Bld: 7.5 % — ABNORMAL HIGH (ref 4.6–6.5)

## 2013-11-24 LAB — CBC WITH DIFFERENTIAL/PLATELET
Basophils Absolute: 0 10*3/uL (ref 0.0–0.1)
Basophils Relative: 0.5 % (ref 0.0–3.0)
Eosinophils Absolute: 0.1 10*3/uL (ref 0.0–0.7)
Eosinophils Relative: 1.8 % (ref 0.0–5.0)
HCT: 37.9 % (ref 36.0–46.0)
HEMOGLOBIN: 12.3 g/dL (ref 12.0–15.0)
LYMPHS ABS: 1.9 10*3/uL (ref 0.7–4.0)
Lymphocytes Relative: 26.3 % (ref 12.0–46.0)
MCHC: 32.5 g/dL (ref 30.0–36.0)
MCV: 83.3 fl (ref 78.0–100.0)
Monocytes Absolute: 0.5 10*3/uL (ref 0.1–1.0)
Monocytes Relative: 7.5 % (ref 3.0–12.0)
Neutro Abs: 4.6 10*3/uL (ref 1.4–7.7)
Neutrophils Relative %: 63.9 % (ref 43.0–77.0)
PLATELETS: 331 10*3/uL (ref 150.0–400.0)
RBC: 4.55 Mil/uL (ref 3.87–5.11)
RDW: 15.6 % — ABNORMAL HIGH (ref 11.5–15.5)
WBC: 7.3 10*3/uL (ref 4.0–10.5)

## 2013-11-24 LAB — TSH: TSH: 2.24 u[IU]/mL (ref 0.35–4.50)

## 2013-11-24 MED ORDER — ALBUTEROL SULFATE HFA 108 (90 BASE) MCG/ACT IN AERS: 2.0000 | INHALATION_SPRAY | Freq: Four times a day (QID) | RESPIRATORY_TRACT | Status: DC | PRN

## 2013-11-24 NOTE — Assessment & Plan Note (Signed)
Working on diet and weight control Check a1c q6-6990mo to monitor same Lab Results  Component Value Date   HGBA1C 7.0* 03/24/2013

## 2013-11-24 NOTE — Assessment & Plan Note (Signed)
On statin therapy for treatment of same since 2010 Check lipids annually and adjust as needed The current medical regimen is effective;  continue present plan and medications.

## 2013-11-24 NOTE — Patient Instructions (Addendum)
It was good to see you today.  We have reviewed your prior records including labs and tests today  ECG today is normal. If recurrent symptoms of chest tightness or trouble breathing occurred despite use of albuterol, please call us for referral to cardiac stress testing as discussed  we'll make referral to [facility] for mammogram. Our office will contact you regarding appointment(s) once made.  Test(s) ordered today. Your results will be released to MyChart (or called to you) after review, usually within 72hours after test completion. If any changes need to be made, you will be notified at that same time.  Medications reviewed and updated, no changes recommended at this time. Refill on medication(s) as discussed today.  Please schedule followup in 6 months, call sooner if problems.  Chest Pain (Nonspecific) It is often hard to give a specific diagnosis for the cause of chest pain. There is always a chance that your pain could be related to something serious, such as a heart attack or a blood clot in the lungs. You need to follow up with your health care provider for further evaluation. CAUSES   Heartburn.  Pneumonia or bronchitis.  Anxiety or stress.  Inflammation around your heart (pericarditis) or lung (pleuritis or pleurisy).  A blood clot in the lung.  A collapsed lung (pneumothorax). It can develop suddenly on its own (spontaneous pneumothorax) or from trauma to the chest.  Shingles infection (herpes zoster virus). The chest wall is composed of bones, muscles, and cartilage. Any of these can be the source of the pain.  The bones can be bruised by injury.  The muscles or cartilage can be strained by coughing or overwork.  The cartilage can be affected by inflammation and become sore (costochondritis). DIAGNOSIS  Lab tests or other studies may be needed to find the cause of your pain. Your health care provider may have you take a test called an ambulatory electrocardiogram  (ECG). An ECG records your heartbeat patterns over a 24-hour period. You may also have other tests, such as:  Transthoracic echocardiogram (TTE). During echocardiography, sound waves are used to evaluate how blood flows through your heart.  Transesophageal echocardiogram (TEE).  Cardiac monitoring. This allows your health care provider to monitor your heart rate and rhythm in real time.  Holter monitor. This is a portable device that records your heartbeat and can help diagnose heart arrhythmias. It allows your health care provider to track your heart activity for several days, if needed.  Stress tests by exercise or by giving medicine that makes the heart beat faster. TREATMENT   Treatment depends on what may be causing your chest pain. Treatment may include:  Acid blockers for heartburn.  Anti-inflammatory medicine.  Pain medicine for inflammatory conditions.  Antibiotics if an infection is present.  You may be advised to change lifestyle habits. This includes stopping smoking and avoiding alcohol, caffeine, and chocolate.  You may be advised to keep your head raised (elevated) when sleeping. This reduces the chance of acid going backward from your stomach into your esophagus. Most of the time, nonspecific chest pain will improve within 2-3 days with rest and mild pain medicine.  HOME CARE INSTRUCTIONS   If antibiotics were prescribed, take them as directed. Finish them even if you start to feel better.  For the next few days, avoid physical activities that bring on chest pain. Continue physical activities as directed.  Do not use any tobacco products, including cigarettes, chewing tobacco, or electronic cigarettes.  Avoid drinking alcohol.  Only take medicine as directed by your health care provider.  Follow your health care provider's suggestions for further testing if your chest pain does not go away.  Keep any follow-up appointments you made. If you do not go to an  appointment, you could develop lasting (chronic) problems with pain. If there is any problem keeping an appointment, call to reschedule. SEEK MEDICAL CARE IF:   Your chest pain does not go away, even after treatment.  You have a rash with blisters on your chest.  You have a fever. SEEK IMMEDIATE MEDICAL CARE IF:   You have increased chest pain or pain that spreads to your arm, neck, jaw, back, or abdomen.  You have shortness of breath.  You have an increasing cough, or you cough up blood.  You have severe back or abdominal pain.  You feel nauseous or vomit.  You have severe weakness.  You faint.  You have chills. This is an emergency. Do not wait to see if the pain will go away. Get medical help at once. Call your local emergency services (911 in [place] Do not drive yourself to the hospital. MAKE SURE YOU:   Understand these instructions.  Will watch your condition.  Will get help right away if you are not doing well or get worse. Document Released: 09/27/2004 Document Revised: 12/23/2012 Document Reviewed: 07/24/2007 [facility] Patient Information 2015 [place], [place]. This information is not intended to replace advice given to you by your health care provider. Make sure you discuss any questions you have with your health care provider. Chest Pain (Nonspecific) It is often hard to give a specific diagnosis for the cause of chest pain. There is always a chance that your pain could be related to something serious, such as a heart attack or a blood clot in the lungs. You need to follow up with your health care provider for further evaluation. CAUSES   Heartburn.  Pneumonia or bronchitis.  Anxiety or stress.  Inflammation around your heart (pericarditis) or lung (pleuritis or pleurisy).  A blood clot in the lung.  A collapsed lung (pneumothorax). It can develop suddenly on its own (spontaneous pneumothorax) or from trauma to the chest.  Shingles infection (herpes zoster  virus). The chest wall is composed of bones, muscles, and cartilage. Any of these can be the source of the pain.  The bones can be bruised by injury.  The muscles or cartilage can be strained by coughing or overwork.  The cartilage can be affected by inflammation and become sore (costochondritis). DIAGNOSIS  Lab tests or other studies may be needed to find the cause of your pain. Your health care provider may have you take a test called an ambulatory electrocardiogram (ECG). An ECG records your heartbeat patterns over a 24-hour period. You may also have other tests, such as:  Transthoracic echocardiogram (TTE). During echocardiography, sound waves are used to evaluate how blood flows through your heart.  Transesophageal echocardiogram (TEE).  Cardiac monitoring. This allows your health care provider to monitor your heart rate and rhythm in real time.  Holter monitor. This is a portable device that records your heartbeat and can help diagnose heart arrhythmias. It allows your health care provider to track your heart activity for several days, if needed.  Stress tests by exercise or by giving medicine that makes the heart beat faster. TREATMENT   Treatment depends on what may be causing your chest pain. Treatment may include:  Acid blockers for heartburn.  Anti-inflammatory medicine.  Pain  medicine for inflammatory conditions.  Antibiotics if an infection is present.  You may be advised to change lifestyle habits. This includes stopping smoking and avoiding alcohol, caffeine, and chocolate.  You may be advised to keep your head raised (elevated) when sleeping. This reduces the chance of acid going backward from your stomach into your esophagus. Most of the time, nonspecific chest pain will improve within 2-3 days with rest and mild pain medicine.  HOME CARE INSTRUCTIONS   If antibiotics were prescribed, take them as directed. Finish them even if you start to feel better.  For  the next few days, avoid physical activities that bring on chest pain. Continue physical activities as directed.  Do not use any tobacco products, including cigarettes, chewing tobacco, or electronic cigarettes.  Avoid drinking alcohol.  Only take medicine as directed by your health care provider.  Follow your health care provider's suggestions for further testing if your chest pain does not go away.  Keep any follow-up appointments you made. If you do not go to an appointment, you could develop lasting (chronic) problems with pain. If there is any problem keeping an appointment, call to reschedule. SEEK MEDICAL CARE IF:   Your chest pain does not go away, even after treatment.  You have a rash with blisters on your chest.  You have a fever. SEEK IMMEDIATE MEDICAL CARE IF:   You have increased chest pain or pain that spreads to your arm, neck, jaw, back, or abdomen.  You have shortness of breath.  You have an increasing cough, or you cough up blood.  You have severe back or abdominal pain.  You feel nauseous or vomit.  You have severe weakness.  You faint.  You have chills. This is an emergency. Do not wait to see if the pain will go away. Get medical help at once. Call your local emergency services (911 in [place] Do not drive yourself to the hospital. MAKE SURE YOU:   Understand these instructions.  Will watch your condition.  Will get help right away if you are not doing well or get worse. Document Released: 09/27/2004 Document Revised: 12/23/2012 Document Reviewed: 07/24/2007 [facility] Patient Information 2015 [place], [place]. This information is not intended to replace advice given to you by your health care provider. Make sure you discuss any questions you have with your health care provider.

## 2013-11-24 NOTE — Assessment & Plan Note (Signed)
BP Readings from Last 3 Encounters:  11/24/13 134/80  05/02/13 120/80  03/24/13 122/84   The current medical regimen is effective;  continue present plan and medications.

## 2013-11-24 NOTE — Progress Notes (Signed)
Subjective:    Patient ID: Christina Thornton, female    DOB: 03-23-1940, 73 y.o.   MRN: [id number]  HPI  Patient is here for follow up  Reviewed chronic medical issues and interval medical events  Past Medical History  Diagnosis Date  . Osteoarthritis   . Glaucoma     [facility]  . Hypertension   . Osteoporosis   . Migraine headache   . Dyslipidemia   . Female bladder prolapse   . GERD (gastroesophageal reflux disease)   . Hyperglycemia     Review of Systems  Constitutional: Negative for fatigue and unexpected weight change.  Respiratory: Positive for chest tightness (3 mo ago in [place], not since), shortness of breath (with prolonged exertion (climbing >4 flights stairs)) and wheezing. Negative for cough.   Cardiovascular: Negative for chest pain, palpitations and leg swelling.  Neurological: Negative for numbness.       Objective:   Physical Exam  BP 134/80 mmHg  Pulse 72  Temp(Src) 97.9 F (36.6 C) (Oral)  Ht 5' (1.524 m)  Wt 176 lb 12 oz (80.173 kg)  BMI 34.52 kg/m2  SpO2 97% Wt Readings from Last 3 Encounters:  11/24/13 176 lb 12 oz (80.173 kg)  05/02/13 179 lb (81.194 kg)  03/24/13 180 lb 1.9 oz (81.702 kg)   Constitutional: She appears well-developed and well-nourished. No distress.  non-English-speaking, daughter at side who serves as Nurse, learning disabilitytranslator Neck: Normal range of motion. Neck supple. No JVD present. No thyromegaly present.  Cardiovascular: Normal rate, regular rhythm and normal heart sounds.  No murmur heard. No BLE edema. Pulmonary/Chest: Effort normal and breath sounds normal. No respiratory distress. She has no wheezes.  Psychiatric: She has a normal mood and affect. Her behavior is normal. Judgment and thought content normal.   Lab Results  Component Value Date   WBC 9.9 02/08/2012   HGB 12.7 02/08/2012   HCT 38.2 02/08/2012   PLT 340.0 02/08/2012   GLUCOSE 118* 03/24/2013   CHOL 129 03/24/2013   TRIG 52.0  03/24/2013   HDL 63.80 03/24/2013   LDLCALC 55 03/24/2013   ALT 12 07/12/2011   AST 20 07/12/2011   NA 138 03/24/2013   K 4.2 03/24/2013   CL 102 03/24/2013   CREATININE 0.7 03/24/2013   BUN 14 03/24/2013   CO2 29 03/24/2013   TSH 1.70 02/08/2012   HGBA1C 7.0* 03/24/2013    Dg Chest 2 View  02/08/2012   *RADIOLOGY REPORT*  Clinical Data: Cough.  CHEST - 2 VIEW  Comparison: None.  Findings: Mild scattered areas of parenchymal scarring present bilaterally.  No evidence of focal consolidation, nodule, edema or pleural fluid.  The heart size is within normal limits.  Bony thorax is unremarkable.  IMPRESSION: No active disease.  Mild bilateral pulmonary scarring.   Original Report Authenticated By: Christina Thornton, M.D.   ECG today: Sinus at 72 beats a minute, occasional PAC. No ischemic changes    Assessment & Plan:   Chest tightness, 4 mo ago, now resolved without recurrence. Patient and daughter believe this is bronchospasm related to exertion (episode 4 months ago [place] greater [place] Okay for albuterol refill. Check screening labs and ECG today. Discussed option of cardiac stress test which both patient and daughter decline given normal ECG. Explained risk of underlying CAD despite normal ECG, especially with known risk factors. Patient and daughter report that they will contact us if recurrent symptoms unresolved with use of  albuterol for further testing as needed  Problem List Items Addressed This Visit    Dyslipidemia    On statin therapy for treatment of same since 2010 Check lipids annually and adjust as needed The current medical regimen is effective;  continue present plan and medications.     Hyperglycemia    Working on diet and weight control Check a1c q6-2087mo to monitor same Lab Results  Component Value Date   HGBA1C 7.0* 03/24/2013      Relevant Orders      Hemoglobin A1c   Hypertension    BP Readings from Last 3 Encounters:    11/24/13 134/80  05/02/13 120/80  03/24/13 122/84   The current medical regimen is effective;  continue present plan and medications.      Other Visit Diagnoses    Chest tightness    -  Primary    Relevant Orders       Basic metabolic panel       CBC with Differential       Lipid panel       TSH       EKG 12-Lead

## 2013-11-24 NOTE — Progress Notes (Signed)
Pre visit review using our clinic review tool, if applicable. No additional management support is needed unless otherwise documented below in the visit note. 

## 2013-11-25 ENCOUNTER — Encounter: Payer: Self-pay | Admitting: Internal Medicine

## 2013-11-25 MED ORDER — METFORMIN HCL ER 500 MG PO TB24: 500.0000 mg | ORAL_TABLET | Freq: Every day | ORAL | Status: DC

## 2013-11-25 NOTE — Addendum Note (Signed)
Addended by: Rene PaciLESCHBER, Trafton Roker A on: 11/25/2013 12:09 PM   Modules accepted: Orders

## 2013-12-03 ENCOUNTER — Telehealth: Payer: Self-pay

## 2013-12-03 DIAGNOSIS — E109 Type 1 diabetes mellitus without complications: Secondary | ICD-10-CM

## 2013-12-03 MED ORDER — ONETOUCH ULTRA SYSTEM W/DEVICE KIT: 1.0000 | PACK | Freq: Once | Status: AC

## 2013-12-03 NOTE — Telephone Encounter (Signed)
Pt daughter called back and

## 2013-12-03 NOTE — Telephone Encounter (Signed)
Pt wants to try diet and exercise to control blood sugar. I sent the glucometer to pharmacy and requested that a blood sugar diary be kept testing before breakfast and before dinner daily. Discontinued the Metformin and indicated pt preference as the discontinue reason. Referral for nutritionist put in as well. Let me know if there is anything to add or change.

## 2013-12-05 NOTE — Telephone Encounter (Signed)
Agreeable with plan, but I believe medications may also be needed We will recheck A1c in 6-12 weeks to monitor progress

## 2013-12-09 ENCOUNTER — Telehealth: Payer: Self-pay | Admitting: Internal Medicine

## 2013-12-09 MED ORDER — GLUCOSE BLOOD VI STRP: ORAL_STRIP | Status: AC

## 2013-12-09 NOTE — Telephone Encounter (Signed)
ERX done

## 2013-12-09 NOTE — Telephone Encounter (Signed)
Patient's daughter would like to know if the glucose monitor kit includes everything the patient needs to monitor her diabetes, including test strips. If not, please send test strips to [facility] in [place]. Pt tests 2x per day.

## 2014-02-18 IMAGING — CR DG CHEST 2V
2 series · 2 of 2 positions shown · non-contrast
Comparison: None.

CLINICAL DATA: Cough.

CHEST - 2 VIEW

[view not recorded (1 of 2)]
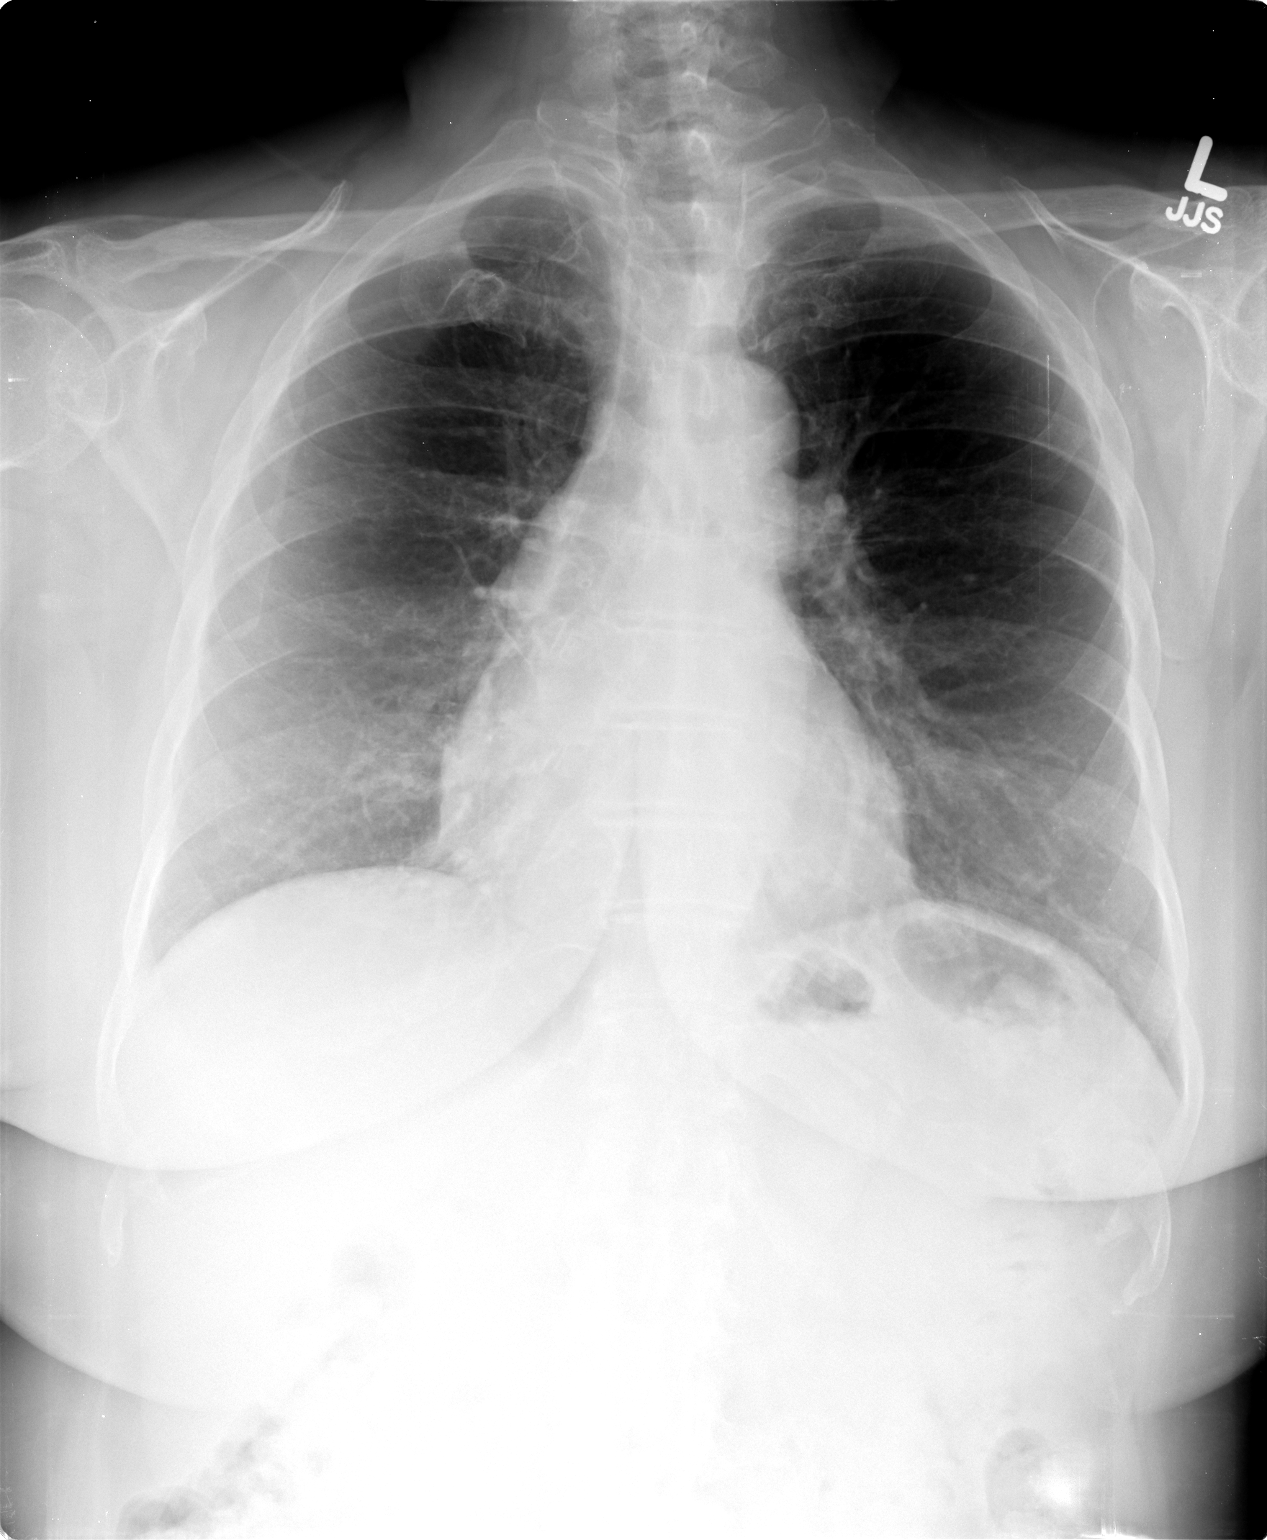

[view not recorded (2 of 2)]
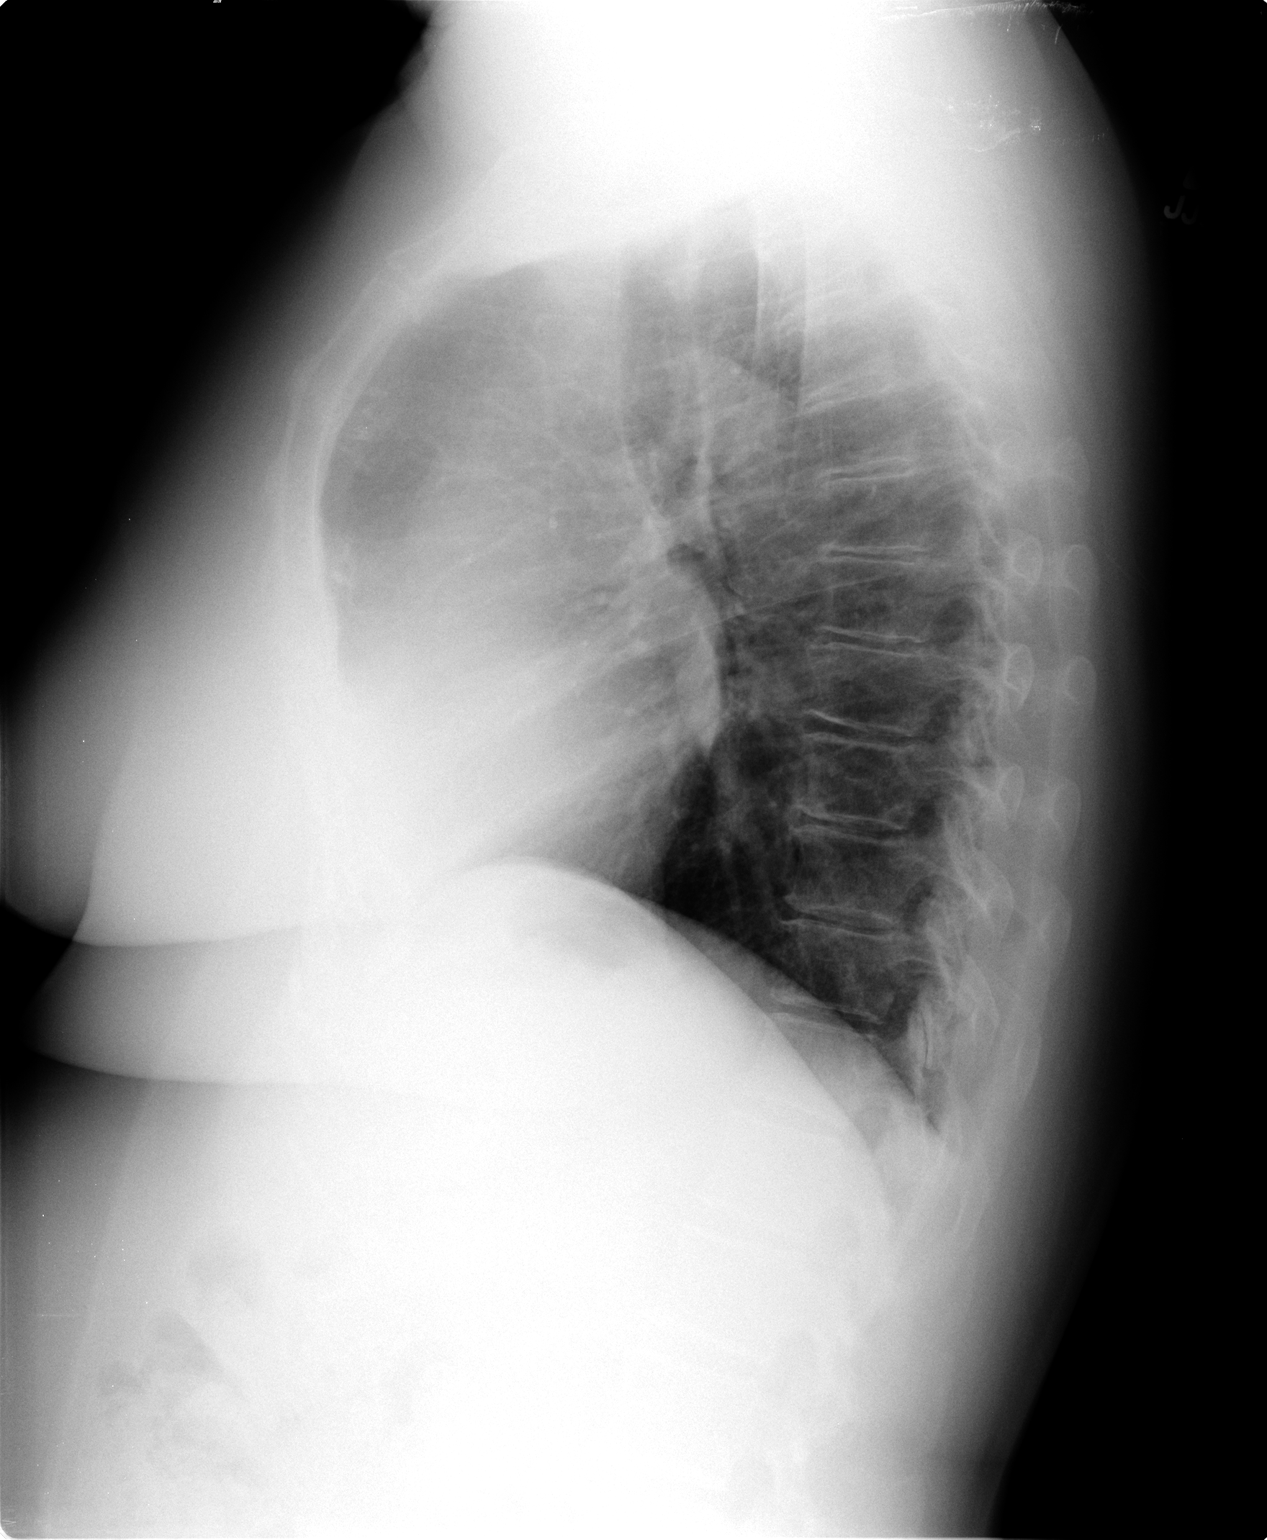

[2 of 2 positions shown; findings below may reference images not displayed]

FINDINGS: Mild scattered areas of parenchymal scarring present
bilaterally.  No evidence of focal consolidation, nodule, edema or
pleural fluid.  The heart size is within normal limits.  Bony
thorax is unremarkable.
IMPRESSION: No active disease.  Mild bilateral pulmonary scarring.

## 2014-02-24 ENCOUNTER — Ambulatory Visit: Payer: BC Managed Care – PPO | Admitting: *Deleted

## 2014-05-25 ENCOUNTER — Ambulatory Visit: Payer: BC Managed Care – PPO | Admitting: Internal Medicine

## 2014-06-01 LAB — HM DIABETES EYE EXAM

## 2014-06-22 LAB — PULMONARY FUNCTION TEST

## 2014-07-16 ENCOUNTER — Telehealth: Payer: Self-pay | Admitting: Internal Medicine

## 2014-07-16 NOTE — Telephone Encounter (Signed)
Called patient to see if a recent mammogram has been done, patient states that she will call back to set up appointment.

## 2014-11-30 ENCOUNTER — Encounter: Payer: Self-pay | Admitting: Internal Medicine

## 2014-11-30 ENCOUNTER — Other Ambulatory Visit (INDEPENDENT_AMBULATORY_CARE_PROVIDER_SITE_OTHER): Payer: Self-pay

## 2014-11-30 ENCOUNTER — Ambulatory Visit (INDEPENDENT_AMBULATORY_CARE_PROVIDER_SITE_OTHER): Payer: Self-pay | Admitting: Internal Medicine

## 2014-11-30 VITALS — BP 128/80 | HR 70 | Temp 98.1°F | Ht 60.0 in | Wt 172.8 lb

## 2014-11-30 DIAGNOSIS — E119 Type 2 diabetes mellitus without complications: Secondary | ICD-10-CM

## 2014-11-30 DIAGNOSIS — J45909 Unspecified asthma, uncomplicated: Secondary | ICD-10-CM

## 2014-11-30 DIAGNOSIS — I10 Essential (primary) hypertension: Secondary | ICD-10-CM

## 2014-11-30 DIAGNOSIS — J449 Chronic obstructive pulmonary disease, unspecified: Secondary | ICD-10-CM

## 2014-11-30 DIAGNOSIS — E785 Hyperlipidemia, unspecified: Secondary | ICD-10-CM

## 2014-11-30 DIAGNOSIS — Z0000 Encounter for general adult medical examination without abnormal findings: Secondary | ICD-10-CM

## 2014-11-30 DIAGNOSIS — Z1239 Encounter for other screening for malignant neoplasm of breast: Secondary | ICD-10-CM

## 2014-11-30 LAB — BASIC METABOLIC PANEL
BUN: 13 mg/dL (ref 6–23)
CO2: 27 mEq/L (ref 19–32)
Calcium: 9.5 mg/dL (ref 8.4–10.5)
Chloride: 105 mEq/L (ref 96–112)
Creatinine, Ser: 0.67 mg/dL (ref 0.40–1.20)
GFR: 91.48 mL/min (ref >60.00)
GLUCOSE: 104 mg/dL — AB (ref 70–99)
POTASSIUM: 4.1 meq/L (ref 3.5–5.1)
SODIUM: 140 meq/L (ref 135–145)

## 2014-11-30 LAB — CBC WITH DIFFERENTIAL/PLATELET
Basophils Absolute: 0 10*3/uL (ref 0.0–0.1)
Basophils Relative: 0.5 % (ref 0.0–3.0)
EOS PCT: 1.6 % (ref 0.0–5.0)
Eosinophils Absolute: 0.1 10*3/uL (ref 0.0–0.7)
HEMATOCRIT: 37.1 % (ref 36.0–46.0)
HEMOGLOBIN: 12 g/dL (ref 12.0–15.0)
LYMPHS ABS: 2.4 10*3/uL (ref 0.7–4.0)
LYMPHS PCT: 27.7 % (ref 12.0–46.0)
MCHC: 32.4 g/dL (ref 30.0–36.0)
MCV: 84 fl (ref 78.0–100.0)
MONOS PCT: 7.2 % (ref 3.0–12.0)
Monocytes Absolute: 0.6 10*3/uL (ref 0.1–1.0)
NEUTROS PCT: 63 % (ref 43.0–77.0)
Neutro Abs: 5.4 10*3/uL (ref 1.4–7.7)
Platelets: 313 10*3/uL (ref 150.0–400.0)
RBC: 4.42 Mil/uL (ref 3.87–5.11)
RDW: 14.2 % (ref 11.5–15.5)
WBC: 8.5 10*3/uL (ref 4.0–10.5)

## 2014-11-30 LAB — LIPID PANEL
CHOLESTEROL: 136 mg/dL (ref 0–200)
HDL: 65.4 mg/dL (ref >39.00)
LDL Cholesterol: 61 mg/dL (ref 0–99)
NonHDL: 71.04
Total CHOL/HDL Ratio: 2
Triglycerides: 50 mg/dL (ref 0.0–149.0)
VLDL: 10 mg/dL (ref 0.0–40.0)

## 2014-11-30 LAB — HEPATIC FUNCTION PANEL
ALBUMIN: 4 g/dL (ref 3.5–5.2)
ALT: 12 U/L (ref 0–35)
AST: 14 U/L (ref 0–37)
Alkaline Phosphatase: 112 U/L (ref 39–117)
Bilirubin, Direct: 0.1 mg/dL (ref 0.0–0.3)
TOTAL PROTEIN: 7.1 g/dL (ref 6.0–8.3)
Total Bilirubin: 0.7 mg/dL (ref 0.2–1.2)

## 2014-11-30 LAB — MICROALBUMIN / CREATININE URINE RATIO
Creatinine,U: 47.4 mg/dL
Microalb Creat Ratio: 1.5 mg/g (ref 0.0–30.0)

## 2014-11-30 LAB — TSH: TSH: 1.71 u[IU]/mL (ref 0.35–4.50)

## 2014-11-30 LAB — HEMOGLOBIN A1C: Hgb A1c MFr Bld: 6.4 % (ref 4.6–6.5)

## 2014-11-30 LAB — VITAMIN D 25 HYDROXY (VIT D DEFICIENCY, FRACTURES): VITD: 39.83 ng/mL (ref 30.00–100.00)

## 2014-11-30 LAB — FERRITIN: FERRITIN: 5 ng/mL — AB (ref 10.0–291.0)

## 2014-11-30 MED ORDER — BECLOMETHASONE DIPROPIONATE 80 MCG/ACT IN AERS: 2.0000 | INHALATION_SPRAY | Freq: Two times a day (BID) | RESPIRATORY_TRACT | Status: DC

## 2014-11-30 NOTE — Assessment & Plan Note (Signed)
On statin therapy for treatment of same since 2010 - low dose atorva Check lipids annually and adjust as needed The current medical regimen is effective;  continue present plan and medications.

## 2014-11-30 NOTE — Patient Instructions (Signed)
It was good to see you today.  We have reviewed your prior records including documentation from [place] consultations and tests today  Health Maintenance reviewed - [facility] and to GI for colon screening - all other recommended immunizations and age-appropriate screenings are up-to-date.  Test(s) ordered today. Your results will be released to MyChart (or called to you) after review, usually within 72hours after test completion. If any changes need to be made, you will be notified at that same time.  Medications reviewed and updated, no changes recommended at this time. Will use "Qvar" for [employer] inhaler if you run out of [place] inhaler supply for your asthma symptoms   Please schedule followup in 6-12 months for semi annual exam and labs, call sooner if problems.

## 2014-11-30 NOTE — Assessment & Plan Note (Signed)
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

## 2014-11-30 NOTE — Progress Notes (Signed)
Subjective:    Patient ID: Christina Thornton, female    DOB: [birth year], 74 y.o.   MRN: [id number]  HPI  patient is here today for annual physical. Patient feels well in general (daughter is translator at patient preference, declines phone translator use)  Also reviewed chronic medical conditions, interval events and current concerns via daughter thru patient interview - she describes "fluid in lungs" dx in [place] 05/2014 - after dx bronch and other heart testing (by description); told NOT TB dx - given medication (T Defcort) for "fluid" which helped shortness of breath - this medication (?steroid) tapered to off over 6 mo, not to continue same (completed early 11/2014), ongoing MDI bid  Notes from [place] pulmonologist:  Patient diagnosed with bronchial asthma aftesymptomatic dyspnea for 6 months. 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 Osteoarthritis   . Glaucoma     follows at [facility] eye surg  . Hypertension   . Osteoporosis   . Migraine headache   . Dyslipidemia   . Female bladder prolapse   . GERD (gastroesophageal reflux disease)   . Diabetes type 2, controlled (HCC) dx 11/2013    a1c 7.5 at dx  . Asthmatic bronchitis , chronic (HCC)     workup with HR CT and bronch/BAL in [place] 07/2014, ?BOOP s/p 6 mo oral steroid taper w/ resolution   Family History  Problem Relation Age of Onset  . Cancer Brother     Kidney cancer   Social History  Substance Use Topics  . Smoking status: Never Smoker   . Smokeless tobacco: None  . Alcohol Use: No    Review of Systems  Constitutional: Positive for fatigue. Negative for unexpected weight change.    Respiratory: Negative for cough, shortness of breath and wheezing.   Cardiovascular: Negative for chest pain, palpitations and leg swelling.  Gastrointestinal: Negative for nausea, abdominal pain and diarrhea.  Musculoskeletal: Positive for neck stiffness (L>R, improves with oil massage).  Neurological: Negative for dizziness, weakness, light-headedness and headaches.  Psychiatric/Behavioral: Negative for dysphoric mood. The patient is not nervous/anxious.   All other systems reviewed and are negative.      Objective:    Physical Exam  Constitutional: She is oriented to person, place, and time. She appears well-developed and well-nourished. No distress.  obese  HENT:  Head: Normocephalic and atraumatic.  Right Ear: External ear normal.  Left Ear: External ear normal.  Nose: Nose normal.  Mouth/Throat: Oropharynx is clear and moist. No oropharyngeal exudate.  Eyes: EOM are normal. Pupils are equal, round, and reactive to light. Right eye exhibits no discharge. Left eye exhibits no discharge. No scleral icterus.  Neck: Normal range of motion. Neck supple. No JVD present. No tracheal deviation present. No thyromegaly present.  Cardiovascular: Normal rate, regular rhythm, normal heart sounds and intact distal pulses.  Exam reveals no friction rub.   No murmur heard. Pulmonary/Chest: Effort normal and breath sounds normal. No respiratory distress. She has no wheezes. She has no rales. She exhibits no tenderness.  Abdominal: Soft. Bowel sounds are normal. She exhibits no distension and no mass. There is no tenderness. There is no rebound and no guarding.  Musculoskeletal:  Normal range of motion. She exhibits no edema.  Lymphadenopathy:    She has no cervical adenopathy.  Neurological: She is alert and oriented to person, place, and time. She has normal reflexes. No cranial nerve deficit.  Skin: Skin is warm and dry. No rash noted. She is not diaphoretic. No erythema.  Psychiatric: She has  a normal mood and affect. Her behavior is normal. Judgment and thought content normal.  Nursing note and vitals reviewed.   BP 128/80 mmHg  Pulse 70  Temp(Src) 98.1 F (36.7 C) (Oral)  Ht 5' (1.524 m)  Wt 172 lb 12 oz (78.359 kg)  BMI 33.74 kg/m2  SpO2 98% Wt Readings from Last 3 Encounters:  11/30/14 172 lb 12 oz (78.359 kg)  11/24/13 176 lb 12 oz (80.173 kg)  05/02/13 179 lb (81.194 kg)    Lab Results  Component Value Date   WBC 7.3 11/24/2013   HGB 12.3 11/24/2013   HCT 37.9 11/24/2013   PLT 331.0 11/24/2013   GLUCOSE 117* 11/24/2013   CHOL 128 11/24/2013   TRIG 63.0 11/24/2013   HDL 58.80 11/24/2013   LDLCALC 57 11/24/2013   ALT 12 07/12/2011   AST 20 07/12/2011   NA 137 11/24/2013   K 4.3 11/24/2013   CL 101 11/24/2013   CREATININE 0.6 11/24/2013   BUN 13 11/24/2013   CO2 27 11/24/2013   TSH 2.24 11/24/2013   HGBA1C 7.5* 11/24/2013    Dg Chest 2 View  02/08/2012  *RADIOLOGY REPORT* Clinical Data: Cough. CHEST - 2 VIEW Comparison: None. Findings: Mild scattered areas of parenchymal scarring present bilaterally.  No evidence of focal consolidation, nodule, edema or pleural fluid.  The heart size is within normal limits.  Bony thorax is unremarkable. IMPRESSION: No active disease.  Mild bilateral pulmonary scarring. Original Report Authenticated By: Irish LackGlenn Yamagata, M.D.       Assessment & Plan:   CPX/z00.00 - Patient has been counseled on age-appropriate routine health concerns for screening and prevention. These are reviewed and up-to-date. Immunizations are up-to-date or declined. Labs ordered and reviewed. (note patient is self pay, no insurance as lives in [place] when not staying with daughter in [place] - financial hardship with mammo and colo screening recommendations)  Problem List Items Addressed This Visit    Asthmatic bronchitis , chronic (HCC)    Reviewed diagnosis and work up in [place] for ?BOOP, resolved symptoms after 6 mo steroid taper Continue steroid  MDI BID ([place] formulation of rx) - will change to Qvar if American rx needed before return to [place] in 05/2015      Relevant Medications   beclomethasone (QVAR) 80 MCG/ACT inhaler   Other Relevant Orders   CBC with Differential/Platelet   Diabetes type 2, controlled (HCC)    Working on diet and weight control recommended to start metformin 11/2013 when a1c >7, but opted for home cbg monitoring and weight reduction efforts Denies increase cbgs while on steroids 05/2014-11/2014 for BOOP(?) - 5 lb weight reduction reviewed Check a1c now and  [id number] to monitor same Lab Results  Component Value Date   HGBA1C 7.5* 11/24/2013        Relevant Orders   Hemoglobin A1c   Lipid panel   Microalbumin / creatinine urine ratio   Basic metabolic panel   Dyslipidemia    On statin therapy for treatment of same since 2010 - low dose atorva Check lipids annually and adjust as needed The current medical regimen is effective;  continue present plan and  medications.       Hypertension    BP Readings from Last 3 Encounters:  11/30/14 128/80  11/24/13 134/80  05/02/13 120/80   The current medical regimen is effective;  continue present plan and medications.       Other Visit Diagnoses    Routine general medical examination at a health care facility    -  Primary    Relevant Orders    Basic metabolic panel    CBC with Differential/Platelet    Hepatic function panel    TSH    Ferritin    VITAMIN D 25 Hydroxy (Vit-D Deficiency, Fractures)    Screening for breast cancer        Relevant Orders    MM DIGITAL SCREENING BILATERAL        Rene Paci, MD

## 2014-11-30 NOTE — Progress Notes (Signed)
Pre visit review using our clinic review tool, if applicable. No additional management support is needed unless otherwise documented below in the visit note. 

## 2014-11-30 NOTE — Assessment & Plan Note (Signed)
BP Readings from Last 3 Encounters:  11/30/14 128/80  11/24/13 134/80  05/02/13 120/80   The current medical regimen is effective;  continue present plan and medications.

## 2014-11-30 NOTE — Assessment & Plan Note (Signed)
Reviewed diagnosis and work up in [place] for ?BOOP, resolved symptoms after 6 mo steroid taper Continue steroid MDI BID ([place] formulation of rx) - will change to Qvar if American rx needed before return to [place] in 05/2015

## 2014-12-03 ENCOUNTER — Telehealth: Payer: Self-pay | Admitting: Internal Medicine

## 2014-12-03 ENCOUNTER — Encounter: Payer: Self-pay | Admitting: Internal Medicine

## 2014-12-03 NOTE — Telephone Encounter (Signed)
Forwarded pt email to PCP

## 2014-12-03 NOTE — Telephone Encounter (Signed)
Pt's daughter called regarding an email she just sent to Dr. Felicity CoyerLeschber. She states Dr. Felicity CoyerLeschber told her take some iron tablets because of some blood results and she wants to know how much on the doses and how many times a day.

## 2014-12-05 MED ORDER — FERROUS SULFATE 325 (65 FE) MG PO TABS: 325.0000 mg | ORAL_TABLET | Freq: Two times a day (BID) | ORAL | Status: AC

## 2015-02-07 ENCOUNTER — Encounter: Payer: Self-pay | Admitting: Internal Medicine

## 2015-02-09 ENCOUNTER — Encounter: Payer: Self-pay | Admitting: Nurse Practitioner

## 2015-02-09 ENCOUNTER — Ambulatory Visit (INDEPENDENT_AMBULATORY_CARE_PROVIDER_SITE_OTHER): Payer: Self-pay | Admitting: Nurse Practitioner

## 2015-02-09 VITALS — BP 130/80 | HR 86 | Temp 97.5°F | Ht 60.0 in | Wt 171.0 lb

## 2015-02-09 DIAGNOSIS — J45909 Unspecified asthma, uncomplicated: Secondary | ICD-10-CM

## 2015-02-09 DIAGNOSIS — J449 Chronic obstructive pulmonary disease, unspecified: Secondary | ICD-10-CM

## 2015-02-09 MED ORDER — BECLOMETHASONE DIPROPIONATE 80 MCG/ACT IN AERS: 2.0000 | INHALATION_SPRAY | Freq: Two times a day (BID) | RESPIRATORY_TRACT | Status: DC

## 2015-02-09 MED ORDER — AZITHROMYCIN 250 MG PO TABS: ORAL_TABLET | ORAL | Status: DC

## 2015-02-09 NOTE — Patient Instructions (Signed)
Mucinex (plain not D or DM) 12 hr kind.   Continue steam with vicks  Azithromycin as directed.

## 2015-02-09 NOTE — Progress Notes (Signed)
Patient ID: Christina Thornton, female    DOB: 1940-11-24  Age: 75 y.o. MRN: [id number]  CC: Allergic Reaction; Nasal Congestion; and Cough   HPI Christina Thornton presents for CC of allergies, nasal congestion, and cough x 1 month. Daughter is translating today.   1) Pt reports allergy symptoms since Jan.  Nasal Congestion- Clear drainage Cough- rattling, wheezing yellow occasionally   Needs   Treatment to date:  OTC allergy medication- had an allergy to this  Steam with vicks and towel over head twice daily 5-10 min.   Sick contacts- Denies   History Christina Thornton has a past medical history of Osteoarthritis; Glaucoma; Hypertension; Osteoporosis; Migraine headache; Dyslipidemia; Female bladder prolapse; GERD (gastroesophageal reflux disease); Diabetes type 2, controlled ([place]) (dx 11/2013); and Asthmatic bronchitis , chronic ([place]).   She has past surgical history that includes No past surgeries.   Her family history includes Cancer in her brother.She reports that she has never smoked. She does not have any smokeless tobacco history on file. She reports that she does not drink alcohol or use illicit drugs.  Outpatient Prescriptions Prior to Visit  Medication Sig Dispense Refill  . albuterol (PROVENTIL HFA;VENTOLIN HFA) 108 (90 BASE) MCG/ACT inhaler Inhale 2 puffs into the lungs every 6 (six) hours as needed for wheezing or shortness of breath. 1 Inhaler 0  . atorvastatin (LIPITOR) 10 MG tablet Take 10 mg by mouth daily.    . Blood Glucose Monitoring Suppl (ONE TOUCH ULTRA SYSTEM KIT) W/DEVICE KIT 1 kit by Does not apply route once. Keep blood glucose diary. Test once before breakfast and once before dinner. 1 each 0  . cetirizine (ZYRTEC) 10 MG tablet Take 1 tablet (10 mg total) by mouth daily. 30 tablet 11  . clopidogrel (PLAVIX) 75 MG tablet Take 75 mg by mouth daily.    . DORZOLAMIDE HCL OP Place 1 drop into both eyes daily.    . ferrous sulfate 325 (65 FE) MG tablet Take 1  tablet (325 mg total) by mouth 2 (two) times daily with a meal. 30 tablet 3  . glucosamine-chondroitin (MAX GLUCOSAMINE CHONDROITIN) 500-400 MG tablet Take 1 tablet by mouth 3 (three) times daily.    Marland Kitchen glucose blood test strip Use test strips to test blood sugar at least twice a day 100 each 12  . latanoprost (XALATAN) 0.005 % ophthalmic solution Place 1 drop into the right eye daily.    Marland Kitchen losartan (COZAAR) 25 MG tablet Take 25 mg by mouth daily.    . RABEprazole (ACIPHEX) 20 MG tablet Take 1 tablet (20 mg total) by mouth daily. 90 tablet 2  . beclomethasone (QVAR) 80 MCG/ACT inhaler Inhale 2 puffs into the lungs 2 (two) times daily. 1 Inhaler 12   No facility-administered medications prior to visit.    ROS Review of Systems  Constitutional: Positive for diaphoresis. Negative for fever, chills and fatigue.  HENT: Positive for congestion, ear pain, postnasal drip, rhinorrhea, sinus pressure and sore throat. Negative for sneezing.   Eyes: Negative for visual disturbance.  Respiratory: Negative for chest tightness, shortness of breath and wheezing.   Cardiovascular: Negative for chest pain, palpitations and leg swelling.  Gastrointestinal: Negative for nausea, vomiting and diarrhea.  Skin: Negative for rash.  Neurological: Positive for headaches. Negative for dizziness.  Psychiatric/Behavioral: The patient is nervous/anxious.     Objective:  BP 130/80 mmHg  Pulse 86  Temp(Src) 97.5 F (36.4 C) (Oral)  Ht 5' (1.524 m)  Wt 171 lb (77.565 kg)  BMI  33.40 kg/m2  SpO2 94%  Physical Exam  Constitutional: She is oriented to person, place, and time. She appears well-developed and well-nourished. No distress.  HENT:  Head: Normocephalic and atraumatic.  Right Ear: External ear normal.  Left Ear: External ear normal.  Mouth/Throat: No oropharyngeal exudate.  Left TM- perforated (not new), no sign of infection Right TM- clear without retraction or bulging  Eyes: EOM are normal. Pupils are  equal, round, and reactive to light. Right eye exhibits no discharge. Left eye exhibits no discharge. No scleral icterus.  Cardiovascular: Normal rate, regular rhythm and normal heart sounds.  Exam reveals no gallop and no friction rub.   No murmur heard. Pulmonary/Chest: Effort normal. No respiratory distress. She has wheezes. She has no rales. She exhibits no tenderness.  Tightness   Neurological: She is alert and oriented to person, place, and time. No cranial nerve deficit. She exhibits normal muscle tone. Coordination normal.  Skin: Skin is warm and dry. No rash noted. She is not diaphoretic.  Psychiatric: She has a normal mood and affect. Her behavior is normal. Judgment and thought content normal.   Assessment & Plan:   Zayanna was seen today for allergic reaction, nasal congestion and cough.  Diagnoses and all orders for this visit:  Asthmatic bronchitis , chronic ([place])  Other orders -     beclomethasone (QVAR) 80 MCG/ACT inhaler; Inhale 2 puffs into the lungs 2 (two) times daily. -     azithromycin (ZITHROMAX) 250 MG tablet; Take 2 tablets by mouth on day 1, take 1 tablet by mouth each day after for 4 days.  I am having Ms. Emert start on azithromycin. I am also having her maintain her clopidogrel, losartan, atorvastatin, latanoprost, RABEprazole, glucosamine-chondroitin, cetirizine, albuterol, ONE TOUCH ULTRA SYSTEM KIT, glucose blood, DORZOLAMIDE HCL OP, ferrous sulfate, and beclomethasone.  Meds ordered this encounter  Medications  . beclomethasone (QVAR) 80 MCG/ACT inhaler    Sig: Inhale 2 puffs into the lungs 2 (two) times daily.    Dispense:  1 Inhaler    Refill:  6  . azithromycin (ZITHROMAX) 250 MG tablet    Sig: Take 2 tablets by mouth on day 1, take 1 tablet by mouth each day after for 4 days.    Dispense:  6 each    Refill:  0    Order Specific Question:  Supervising Provider    Answer:  Crecencio Mc [[id number]]     Follow-up: Return if symptoms worsen  or fail to improve.

## 2015-02-10 ENCOUNTER — Encounter: Payer: Self-pay | Admitting: Nurse Practitioner

## 2015-02-10 ENCOUNTER — Telehealth: Payer: Self-pay | Admitting: Internal Medicine

## 2015-02-10 NOTE — Telephone Encounter (Signed)
Pt was seen yesterday and was given a prescription for an inhaler. Pt has no insurance and it will cost over $200. She's wondering if there is a cheaper alternative. pts daughter can be reached at [phone] Pt didn't sleep well last night and she is hoping a call back asap

## 2015-02-11 ENCOUNTER — Telehealth: Payer: Self-pay | Admitting: Emergency Medicine

## 2015-02-11 ENCOUNTER — Other Ambulatory Visit: Payer: Self-pay | Admitting: Nurse Practitioner

## 2015-02-11 MED ORDER — ALBUTEROL SULFATE HFA 108 (90 BASE) MCG/ACT IN AERS: 2.0000 | INHALATION_SPRAY | Freq: Four times a day (QID) | RESPIRATORY_TRACT | Status: DC | PRN

## 2015-02-11 NOTE — Telephone Encounter (Signed)
Spoke to pts daughter. Sending inhaler to [facility] on [address]. Advised her to pick this up and then let us know if this does not help her cough.

## 2015-02-11 NOTE — Telephone Encounter (Signed)
-----   Message from Carollee Leitz, NP sent at 02/11/2015  8:53 AM EST ----- Please call pt and see if they got my message about a new inhaler- I'm suggesting Ventolin HFA for quick relief of her symptoms. Where does she want this?

## 2015-02-11 NOTE — Assessment & Plan Note (Signed)
New problem to me Probable exacerbation Qvar too expensive Patient needs short term help probably choose Ventolin HFA if agreeable.  Z-Pak sent with patient to pharmacy We'll follow up as needed

## 2015-02-11 NOTE — Telephone Encounter (Signed)
You have not seen pt yet. Rx for an inhaler (per pt rq) was issued on Wednesday.   Do you know of a cheaper alternative?

## 2015-02-12 NOTE — Telephone Encounter (Signed)
She has two inhalers on her list.  There is no cheaper alternative to the albuterol.  There may be a cheaper alternative to the Qvar - her daughter can always call her pharmacy and ask them - they may be able to tell which one would be cheaper.

## 2015-02-14 NOTE — Telephone Encounter (Signed)
My chart sent to pt dtr.

## 2015-03-21 ENCOUNTER — Encounter: Payer: Self-pay | Admitting: Family Medicine

## 2015-03-21 ENCOUNTER — Ambulatory Visit (INDEPENDENT_AMBULATORY_CARE_PROVIDER_SITE_OTHER): Payer: Self-pay | Admitting: Family Medicine

## 2015-03-21 ENCOUNTER — Telehealth: Payer: Self-pay | Admitting: Family Medicine

## 2015-03-21 DIAGNOSIS — J45909 Unspecified asthma, uncomplicated: Secondary | ICD-10-CM

## 2015-03-21 DIAGNOSIS — J449 Chronic obstructive pulmonary disease, unspecified: Secondary | ICD-10-CM

## 2015-03-21 MED ORDER — ALBUTEROL SULFATE HFA 108 (90 BASE) MCG/ACT IN AERS: 2.0000 | INHALATION_SPRAY | Freq: Four times a day (QID) | RESPIRATORY_TRACT | Status: AC | PRN

## 2015-03-21 MED ORDER — PREDNISONE 20 MG PO TABS: 20.0000 mg | ORAL_TABLET | Freq: Every day | ORAL | Status: AC

## 2015-03-21 MED ORDER — DOXYCYCLINE HYCLATE 100 MG PO TABS: 100.0000 mg | ORAL_TABLET | Freq: Two times a day (BID) | ORAL | Status: AC

## 2015-03-21 MED ORDER — FLUTICASONE PROPIONATE HFA 110 MCG/ACT IN AERO: 2.0000 | INHALATION_SPRAY | Freq: Two times a day (BID) | RESPIRATORY_TRACT | Status: AC

## 2015-03-21 NOTE — Progress Notes (Signed)
Patient ID: Christina Thornton, female   DOB: 18-Dec-1940, 75 y.o.   MRN: [id number]    Christina Thornton , 1940/04/16, 75 y.o., female MRN: [id number]  CC: URI x 2 days Subjective: Pt presents for an acute OV with complaints of cough, wheezing, throat congestion, nasal congestion, PND, facial pressure, myalgias and nausea of 2 days duration. Pt has tried nothing  to ease their symptoms. Her son-in-law was ill.  She has been using albuterol, using BID since Saturday, before then did not use routinely. Prescribed QVAR, not using secondary to financial reasons, self pay. She denies fever or chills, vomit, diarrhea. She was seen by PCP last month with similar symptoms. She is prescribed zyrtec, but is not taking.   Allergies  Allergen Reactions  . Chlorpheniramine Maleate Swelling    Patient stated that this medication caused severe eye swelling.    Social History  Substance Use Topics  . Smoking status: Never Smoker   . Smokeless tobacco: Not on file  . Alcohol Use: No   Past Medical History  Diagnosis Date  . Osteoarthritis   . Glaucoma     follows at [facility] surg  . Hypertension   . Osteoporosis   . Migraine headache   . Dyslipidemia   . Female bladder prolapse   . GERD (gastroesophageal reflux disease)   . Diabetes type 2, controlled ([place]) dx 11/2013    a1c 7.5 at dx  . Asthmatic bronchitis , chronic ([place])     workup with HR CT and bronch/BAL in [place] 07/2014, ?BOOP s/p 6 mo oral steroid taper w/ resolution   Past Surgical History  Procedure Laterality Date  . No past surgeries     Family History  Problem Relation Age of Onset  . Cancer Brother     Kidney cancer     Medication List       This list is accurate as of: 03/21/15  2:12 PM.  Always use your most recent med list.               albuterol 108 (90 Base) MCG/ACT inhaler  Commonly known as:  PROVENTIL HFA;VENTOLIN HFA  Inhale 2 puffs into the lungs every 6 (six) hours as needed for wheezing or  shortness of breath.     atorvastatin 10 MG tablet  Commonly known as:  LIPITOR  Take 10 mg by mouth daily.     beclomethasone 80 MCG/ACT inhaler  Commonly known as:  QVAR  Inhale 2 puffs into the lungs 2 (two) times daily.     cetirizine 10 MG tablet  Commonly known as:  ZYRTEC  Take 1 tablet (10 mg total) by mouth daily.     clopidogrel 75 MG tablet  Commonly known as:  PLAVIX  Take 75 mg by mouth daily.     DORZOLAMIDE HCL OP  Place 1 drop into both eyes daily.     ferrous sulfate 325 (65 FE) MG tablet  Take 1 tablet (325 mg total) by mouth 2 (two) times daily with a meal.     glucosamine-chondroitin 500-400 MG tablet  Commonly known as:  MAX GLUCOSAMINE CHONDROITIN  Take 1 tablet by mouth 3 (three) times daily.     glucose blood test strip  Use test strips to test blood sugar at least twice a day     latanoprost 0.005 % ophthalmic solution  Commonly known as:  XALATAN  Place 1 drop into the right eye daily.     losartan 25 MG tablet  Commonly known as:  COZAAR  Take 25 mg by mouth daily.     ONE TOUCH ULTRA SYSTEM KIT w/Device Kit  1 kit by Does not apply route once. Keep blood glucose diary. Test once before breakfast and once before dinner.     RABEprazole 20 MG tablet  Commonly known as:  ACIPHEX  Take 1 tablet (20 mg total) by mouth daily.         ROS: Negative, with the exception of above mentioned in HPI   Objective:  BP 96/64 mmHg  Pulse 86  Temp(Src) 98.9 F (37.2 C)  Resp 20  Wt 167 lb 8 oz (75.978 kg)  SpO2 94% Body mass index is 32.71 kg/(m^2). Gen: Afebrile. No acute distress. Nontoxic in appearance, well developed, well nourished. [place] female.  HENT: AT. [place]. Bilateral TM visualized, with mild air flud level right and chronic perforation left. MMM, no oral lesions. Bilateral nares with erythema, swelling, drainage. Throat without erythema or exudates. No cough,  hoarseness on exam. Facial sinus pressure present.  Eyes:Pupils Equal  Round Reactive to light, Extraocular movements intact,  Conjunctiva without redness, discharge or icterus. Neck/lymp/endocrine: Supple,no lymphadenopathy CV: RRR  Chest: CTAB, no wheeze or crackles. Good air movement, normal resp effort.  Abd: Soft. NTND. BS present Skin: No rashes, purpura or petechiae.  Neuro: Normal gait. PERLA. EOMi. Alert. Oriented x3   Assessment/Plan: Christina Thornton is a 75 y.o. female present for acute OV for  1. Asthmatic bronchitis , chronic (HCC) with acute flare - doxycycline (VIBRA-TABS) 100 MG tablet; Take 1 tablet (100 mg total) by mouth 2 (two) times daily.  Dispense: 20 tablet; Refill: 0 - predniSONE (DELTASONE) 20 MG tablet; Take 1 tablet (20 mg total) by mouth daily with breakfast. 60 mg x3d, 40 mg x3d, 20 mg x2d, 10 mg x2d  Dispense: 18 tablet; Refill: 0 - Zyrtec, flonase daily. Nasal saline . Mucinex.  - Albuterol every 6 hours as needed.  - Fluticasone BID daily.  - F/u with PCP or if not improveing within 1 week.     > 25 minutes spent with patient, >50% of time spent face to face counseling patient and coordinating care.  electronically signed by:  Howard Pouch, DO  [place]

## 2015-03-21 NOTE — Patient Instructions (Signed)
Zyrtec, flonase daily over the counter and should be taken daily.  Nasal saline . Mucinex. (all over the counter medications) Albuterol every 6 hours as needed for shortness of breath or wheezing. If needing more than 2x a week (when not sick) will need to see.  Fluticasone 2x a day everyday (new inhaler) doxcycline x 10 days (antibiotic)    Sinusitis, Adult Sinusitis is redness, soreness, and inflammation of the paranasal sinuses. Paranasal sinuses are air pockets within the bones of your face. They are located beneath your eyes, in the middle of your forehead, and above your eyes. In healthy paranasal sinuses, mucus is able to drain out, and air is able to circulate through them by way of your nose. However, when your paranasal sinuses are inflamed, mucus and air can become trapped. This can allow bacteria and other germs to grow and cause infection. Sinusitis can develop quickly and last only a short time (acute) or continue over a long period (chronic). Sinusitis that lasts for more than 12 weeks is considered chronic. CAUSES Causes of sinusitis include:  Allergies.  Structural abnormalities, such as displacement of the cartilage that separates your nostrils (deviated septum), which can decrease the air flow through your nose and sinuses and affect sinus drainage.  Functional abnormalities, such as when the small hairs (cilia) that line your sinuses and help remove mucus do not work properly or are not present. SIGNS AND SYMPTOMS Symptoms of acute and chronic sinusitis are the same. The primary symptoms are pain and pressure around the affected sinuses. Other symptoms include:  Upper toothache.  Earache.  Headache.  Bad breath.  Decreased sense of smell and taste.  A cough, which worsens when you are lying flat.  Fatigue.  Fever.  Thick drainage from your nose, which often is green and may contain pus (purulent).  Swelling and warmth over the affected  sinuses. DIAGNOSIS Your health care provider will perform a physical exam. During your exam, your health care provider may perform any of the following to help determine if you have acute sinusitis or chronic sinusitis:  Look in your nose for signs of abnormal growths in your nostrils (nasal polyps).  Tap over the affected sinus to check for signs of infection.  View the inside of your sinuses using an imaging device that has a light attached (endoscope). If your health care provider suspects that you have chronic sinusitis, one or more of the following tests may be recommended:  Allergy tests.  Nasal culture. A sample of mucus is taken from your nose, sent to a lab, and screened for bacteria.  Nasal cytology. A sample of mucus is taken from your nose and examined by your health care provider to determine if your sinusitis is related to an allergy. TREATMENT Most cases of acute sinusitis are related to a viral infection and will resolve on their own within 10 days. Sometimes, medicines are prescribed to help relieve symptoms of both acute and chronic sinusitis. These may include pain medicines, decongestants, nasal steroid sprays, or saline sprays. However, for sinusitis related to a bacterial infection, your health care provider will prescribe antibiotic medicines. These are medicines that will help kill the bacteria causing the infection. Rarely, sinusitis is caused by a fungal infection. In these cases, your health care provider will prescribe antifungal medicine. For some cases of chronic sinusitis, surgery is needed. Generally, these are cases in which sinusitis recurs more than 3 times per year, despite other treatments. HOME CARE INSTRUCTIONS  Drink plenty  of water. Water helps thin the mucus so your sinuses can drain more easily.  Use a humidifier.  Inhale steam 3-4 times a day (for example, sit in the bathroom with the shower running).  Apply a warm, moist washcloth to your face  3-4 times a day, or as directed by your health care provider.  Use saline nasal sprays to help moisten and clean your sinuses.  Take medicines only as directed by your health care provider.  If you were prescribed either an antibiotic or antifungal medicine, finish it all even if you start to feel better. SEEK IMMEDIATE MEDICAL CARE IF:  You have increasing pain or severe headaches.  You have nausea, vomiting, or drowsiness.  You have swelling around your face.  You have vision problems.  You have a stiff neck.  You have difficulty breathing.   This information is not intended to replace advice given to you by your health care provider. Make sure you discuss any questions you have with your health care provider.   Document Released: 12/18/2004 Document Revised: 01/08/2014 Document Reviewed: 01/02/2011 [employer] Interactive Patient Education [employer]

## 2015-03-21 NOTE — Telephone Encounter (Signed)
Patient's daughter called back. Is it okay for patient to take steriods with glaucoma? Patient's daughter also said the fluticasone was $200. She is at the pharmacy now. Advised her not to wait as that doctor is still seeing patients.

## 2015-03-22 ENCOUNTER — Encounter: Payer: Self-pay | Admitting: Family Medicine

## 2015-03-22 NOTE — Telephone Encounter (Signed)
Sent message in my chart. 

## 2015-03-22 NOTE — Telephone Encounter (Signed)
Please call pt: - There is not a replacement of steroid in the treatment of asthma. She should be encouraged to ask her eye doctor if it is contraindicated. It depends on her type of glaucoma/contdition and length of steroid treatment. She may not be able to take the steroid.  - If this is the case she should continue abx and albuterol.

## 2015-03-22 NOTE — Telephone Encounter (Signed)
Patient daughter called states she thinks her mother is not supposed to take steroids she wants to know if you can call in something else. Please advise.

## 2015-04-01 ENCOUNTER — Encounter: Payer: Self-pay | Admitting: Family Medicine

## 2015-04-01 NOTE — Telephone Encounter (Signed)
Please advise. Thanks.  

## 2015-09-01 ENCOUNTER — Encounter: Payer: Self-pay | Admitting: Student

## 2015-12-01 ENCOUNTER — Ambulatory Visit: Payer: Self-pay | Admitting: Family Medicine

## 2015-12-01 ENCOUNTER — Ambulatory Visit: Payer: Self-pay | Admitting: Internal Medicine
